# Patient Record
Sex: Male | Born: 1945
Health system: Southern US, Community
[De-identification: ages and names within clinical notes are randomized; demographics above are authoritative.]

## PROBLEM LIST (undated history)

## (undated) DIAGNOSIS — E119 Type 2 diabetes mellitus without complications: Secondary | ICD-10-CM

## (undated) DIAGNOSIS — K219 Gastro-esophageal reflux disease without esophagitis: Secondary | ICD-10-CM

## (undated) DIAGNOSIS — I10 Essential (primary) hypertension: Secondary | ICD-10-CM

## (undated) DIAGNOSIS — M1990 Unspecified osteoarthritis, unspecified site: Secondary | ICD-10-CM

## (undated) DIAGNOSIS — E079 Disorder of thyroid, unspecified: Secondary | ICD-10-CM

## (undated) DIAGNOSIS — H269 Unspecified cataract: Secondary | ICD-10-CM

## (undated) HISTORY — DX: Gastro-esophageal reflux disease without esophagitis: K21.9

## (undated) HISTORY — PX: HERNIA REPAIR: SHX51

## (undated) HISTORY — PX: TONSILLECTOMY: SUR1361

## (undated) HISTORY — DX: Type 2 diabetes mellitus without complications: E11.9

## (undated) HISTORY — DX: Essential (primary) hypertension: I10

## (undated) HISTORY — PX: EYE SURGERY: SHX253

## (undated) HISTORY — DX: Unspecified osteoarthritis, unspecified site: M19.90

## (undated) HISTORY — PX: FOOT SURGERY: SHX648

## (undated) HISTORY — DX: Unspecified cataract: H26.9

## (undated) HISTORY — DX: Disorder of thyroid, unspecified: E07.9

---

## 2007-08-01 ENCOUNTER — Emergency Department (HOSPITAL_COMMUNITY): Admission: EM | Admit: 2007-08-01 | Discharge: 2007-08-01 | Payer: Self-pay | Admitting: Emergency Medicine

## 2011-05-26 ENCOUNTER — Ambulatory Visit (INDEPENDENT_AMBULATORY_CARE_PROVIDER_SITE_OTHER): Payer: Medicare Other | Admitting: Emergency Medicine

## 2011-05-26 DIAGNOSIS — F329 Major depressive disorder, single episode, unspecified: Secondary | ICD-10-CM

## 2011-05-26 DIAGNOSIS — I10 Essential (primary) hypertension: Secondary | ICD-10-CM

## 2011-05-26 DIAGNOSIS — E039 Hypothyroidism, unspecified: Secondary | ICD-10-CM

## 2011-05-26 DIAGNOSIS — E7800 Pure hypercholesterolemia, unspecified: Secondary | ICD-10-CM

## 2011-05-26 DIAGNOSIS — E119 Type 2 diabetes mellitus without complications: Secondary | ICD-10-CM

## 2011-05-26 DIAGNOSIS — E785 Hyperlipidemia, unspecified: Secondary | ICD-10-CM

## 2011-05-26 LAB — POCT GLYCOSYLATED HEMOGLOBIN (HGB A1C): Hemoglobin A1C: 5.9

## 2011-05-26 LAB — GLUCOSE, POCT (MANUAL RESULT ENTRY): POC Glucose: 84

## 2011-05-26 MED ORDER — NIACIN ER (ANTIHYPERLIPIDEMIC) 1000 MG PO TBCR: 1000.0000 mg | EXTENDED_RELEASE_TABLET | Freq: Every day | ORAL | Status: DC

## 2011-05-26 MED ORDER — METFORMIN HCL 500 MG PO TABS: 500.0000 mg | ORAL_TABLET | Freq: Two times a day (BID) | ORAL | Status: DC

## 2011-05-26 MED ORDER — ASPIRIN 81 MG PO TABS: 81.0000 mg | ORAL_TABLET | Freq: Every day | ORAL | Status: AC

## 2011-05-26 MED ORDER — LEVOTHYROXINE SODIUM 150 MCG PO TABS: 150.0000 ug | ORAL_TABLET | Freq: Every day | ORAL | Status: DC

## 2011-05-26 MED ORDER — LISINOPRIL 20 MG PO TABS: 20.0000 mg | ORAL_TABLET | Freq: Every day | ORAL | Status: DC

## 2011-05-26 MED ORDER — ATORVASTATIN CALCIUM 10 MG PO TABS: 10.0000 mg | ORAL_TABLET | Freq: Every day | ORAL | Status: DC

## 2011-05-26 MED ORDER — FLUOXETINE HCL 20 MG PO TABS: 20.0000 mg | ORAL_TABLET | Freq: Every day | ORAL | Status: DC

## 2011-05-26 MED ORDER — HYDROCHLOROTHIAZIDE 25 MG PO TABS: 25.0000 mg | ORAL_TABLET | Freq: Every day | ORAL | Status: DC

## 2011-05-26 MED ORDER — VITAMIN D 1000 UNITS PO TABS: 1000.0000 [IU] | ORAL_TABLET | Freq: Two times a day (BID) | ORAL | Status: AC

## 2011-05-26 NOTE — Progress Notes (Signed)
  Subjective:    Patient ID: Jackson Sellers, male    DOB: 03-Mar-1946, 66 y.o.   MRN: [id number]  HPI patient enters for followup of his diabetes and high blood pressure high cholesterol thyroid disease and hyper triglycerides. He states he's been doing well he denies any complaints of chest pain shortness of breath GI or GU complaints.    Review of Systems Uncontrolled    Objective:   Physical Exam  Constitutional: He appears well-developed and well-nourished.  HENT:  Head: Normocephalic and atraumatic.  Eyes: Conjunctivae and EOM are normal. Pupils are equal, round, and reactive to light.  Neck: Normal range of motion. Neck supple. No JVD present. No tracheal deviation present. No thyromegaly present.  Cardiovascular: Normal rate, regular rhythm, normal heart sounds and intact distal pulses.  Exam reveals no gallop and no friction rub.   No murmur heard. Pulmonary/Chest: No respiratory distress. He has no wheezes. He has no rales. He exhibits no tenderness.  Abdominal: Soft. He exhibits no distension and no mass. There is no tenderness. There is no rebound and no guarding.         Assessment & Plan:  Patient is doing well with his diabetes his hemoglobin A1c was under six. We'll refill all his meds plan for his physical exam in June.

## 2011-06-20 ENCOUNTER — Ambulatory Visit (INDEPENDENT_AMBULATORY_CARE_PROVIDER_SITE_OTHER): Payer: Medicare Other | Admitting: Family Medicine

## 2011-06-20 VITALS — BP 145/76 | HR 87 | Temp 98.4°F | Resp 18 | Ht 68.0 in | Wt 262.0 lb

## 2011-06-20 DIAGNOSIS — M545 Low back pain, unspecified: Secondary | ICD-10-CM

## 2011-06-20 DIAGNOSIS — M549 Dorsalgia, unspecified: Secondary | ICD-10-CM

## 2011-06-20 LAB — POCT URINALYSIS DIPSTICK
Bilirubin, UA: NEGATIVE
Glucose, UA: NEGATIVE
Ketones, UA: NEGATIVE
Leukocytes, UA: NEGATIVE
Nitrite, UA: NEGATIVE
Protein, UA: NEGATIVE
Spec Grav, UA: 1.01
Urobilinogen, UA: 0.2
pH, UA: 5.5

## 2011-06-20 LAB — POCT UA - MICROSCOPIC ONLY
Casts, Ur, LPF, POC: NEGATIVE
Crystals, Ur, HPF, POC: NEGATIVE
Mucus, UA: NEGATIVE
Yeast, UA: NEGATIVE

## 2011-06-20 MED ORDER — MELOXICAM 7.5 MG PO TABS: 7.5000 mg | ORAL_TABLET | Freq: Every day | ORAL | Status: AC

## 2011-06-20 NOTE — Progress Notes (Signed)
66 yo man with right flank pain which is intermittent for 2 weeks, dull, and worse with movement.  No h/o kidney stones.  No blood in urine or nausea.  Overall feels fine.  Retired.  Pain worsened after sexual activity.  No new yard work or hobbies. Hasn't tried anything for pain,  Pain has not been very bad. No fevers.  Sleeping more during the day than the night since wife has started job that starts at 6 am.  O:  NAD Abdomen:  nontender Back:  Full range of motion, tender lower right 12th rib Nontender CVAT Results for orders placed in visit on 06/20/11  POCT UA - MICROSCOPIC ONLY      Component Value Range   WBC, Ur, HPF, POC 1-2     RBC, urine, microscopic 1-3     Bacteria, U Microscopic trace     Mucus, UA negative     Epithelial cells, urine per micros 1-2     Crystals, Ur, HPF, POC negative     Casts, Ur, LPF, POC negative     Yeast, UA negative    POCT URINALYSIS DIPSTICK      Component Value Range   Color, UA yellow     Clarity, UA clear     Glucose, UA negative     Bilirubin, UA negative     Ketones, UA negative     Spec Grav, UA 1.010     Blood, UA trace     pH, UA 5.5     Protein, UA negative     Urobilinogen, UA 0.2     Nitrite, UA negative     Leukocytes, UA Negative         A:  Muscle strain  P:  meloxicam 7.5 x 10 days on daily basis.

## 2011-06-20 NOTE — Patient Instructions (Signed)

## 2011-07-19 ENCOUNTER — Ambulatory Visit: Payer: Medicare Other

## 2011-07-19 ENCOUNTER — Ambulatory Visit (INDEPENDENT_AMBULATORY_CARE_PROVIDER_SITE_OTHER): Payer: Medicare Other | Admitting: Emergency Medicine

## 2011-07-19 DIAGNOSIS — M549 Dorsalgia, unspecified: Secondary | ICD-10-CM

## 2011-07-19 DIAGNOSIS — R079 Chest pain, unspecified: Secondary | ICD-10-CM

## 2011-07-19 MED ORDER — CYCLOBENZAPRINE HCL 5 MG PO TABS: 5.0000 mg | ORAL_TABLET | ORAL | Status: AC

## 2011-07-19 MED ORDER — NABUMETONE 750 MG PO TABS: 750.0000 mg | ORAL_TABLET | Freq: Two times a day (BID) | ORAL | Status: AC

## 2011-07-19 NOTE — Progress Notes (Signed)
  Subjective:    Patient ID: Jackson Sellers, male    DOB: [birth year], 66 y.o.   MRN: [id number]  HPI patient states that one month ago he had some bad episodes of cough. He feels like he may have injured his chest wall. He then had sexual intercourse with his wife and feels he may have strained something in his side. He has had persistent right lateral rib pain. He denies shortness of breath B. he denies urinary symptoms. He has seen Dr. Milus Glazier and was treated as a muscle strain however continues to have discomfort.    Review of Systems on current medical problems are under control without specific complaints of     Objective:   Physical Exam physical exam reveals an alert cooperative gentleman who is not in distress. His chest is clear to auscultation and percussion. He has tenderness over the lateral lower chest wall over the posterior right 11th and 12th ribs. There is some tenderness over the lower lumbar spine. His deep tendon reflexes are 2+ and symmetrical and motor strength is 5 out of 5.  [facility] reading (PRIMARY) by  Dr.Madalin Hughart chest x-ray shows some atelectatic areas in both bases. Cardiac shadow is upper limits of normal. Rib films do not show any definite fracture lumbar spine film shows degenerative changes otherwise unremarkable. .       Assessment & Plan:  He could have cracked a rib or strained a muscle in his upper lumbar spine. We'll check films of the right ribs as well as lumbar spine

## 2011-09-14 ENCOUNTER — Encounter: Payer: Self-pay | Admitting: Emergency Medicine

## 2011-11-09 ENCOUNTER — Encounter: Payer: Medicare Other | Admitting: Emergency Medicine

## 2012-02-22 ENCOUNTER — Other Ambulatory Visit: Payer: Self-pay | Admitting: Emergency Medicine

## 2012-02-22 ENCOUNTER — Ambulatory Visit (INDEPENDENT_AMBULATORY_CARE_PROVIDER_SITE_OTHER): Payer: Medicare Other | Admitting: Emergency Medicine

## 2012-02-22 ENCOUNTER — Encounter: Payer: Self-pay | Admitting: Emergency Medicine

## 2012-02-22 VITALS — BP 122/80 | HR 92 | Temp 98.0°F | Resp 20 | Ht 69.0 in | Wt 262.0 lb

## 2012-02-22 DIAGNOSIS — E785 Hyperlipidemia, unspecified: Secondary | ICD-10-CM

## 2012-02-22 DIAGNOSIS — M1990 Unspecified osteoarthritis, unspecified site: Secondary | ICD-10-CM

## 2012-02-22 DIAGNOSIS — K219 Gastro-esophageal reflux disease without esophagitis: Secondary | ICD-10-CM

## 2012-02-22 DIAGNOSIS — Z23 Encounter for immunization: Secondary | ICD-10-CM

## 2012-02-22 DIAGNOSIS — F32A Depression, unspecified: Secondary | ICD-10-CM

## 2012-02-22 DIAGNOSIS — Z0000 Encounter for general adult medical examination without abnormal findings: Secondary | ICD-10-CM

## 2012-02-22 DIAGNOSIS — E119 Type 2 diabetes mellitus without complications: Secondary | ICD-10-CM

## 2012-02-22 DIAGNOSIS — I10 Essential (primary) hypertension: Secondary | ICD-10-CM

## 2012-02-22 DIAGNOSIS — Z139 Encounter for screening, unspecified: Secondary | ICD-10-CM

## 2012-02-22 DIAGNOSIS — E039 Hypothyroidism, unspecified: Secondary | ICD-10-CM

## 2012-02-22 DIAGNOSIS — M129 Arthropathy, unspecified: Secondary | ICD-10-CM

## 2012-02-22 DIAGNOSIS — F329 Major depressive disorder, single episode, unspecified: Secondary | ICD-10-CM

## 2012-02-22 LAB — LIPID PANEL
HDL: 37 mg/dL — ABNORMAL LOW (ref >39)
LDL Cholesterol: 101 mg/dL — ABNORMAL HIGH (ref 0–99)
Triglycerides: 286 mg/dL — ABNORMAL HIGH (ref <150)
VLDL: 57 mg/dL — ABNORMAL HIGH (ref 0–40)

## 2012-02-22 LAB — CBC WITH DIFFERENTIAL/PLATELET
Basophils Absolute: 0 10*3/uL (ref 0.0–0.1)
Basophils Relative: 0 % (ref 0–1)
Eosinophils Relative: 3 % (ref 0–5)
Lymphocytes Relative: 26 % (ref 12–46)
MCHC: 35.4 g/dL (ref 30.0–36.0)
Monocytes Absolute: 0.6 10*3/uL (ref 0.1–1.0)
Neutro Abs: 4.9 10*3/uL (ref 1.7–7.7)
Platelets: 270 10*3/uL (ref 150–400)
RDW: 13.5 % (ref 11.5–15.5)
WBC: 7.7 10*3/uL (ref 4.0–10.5)

## 2012-02-22 LAB — COMPREHENSIVE METABOLIC PANEL
ALT: 8 U/L (ref 0–53)
AST: 14 U/L (ref 0–37)
Alkaline Phosphatase: 43 U/L (ref 39–117)
BUN: 13 mg/dL (ref 6–23)
Calcium: 9.5 mg/dL (ref 8.4–10.5)
Chloride: 102 mEq/L (ref 96–112)
Creat: 1.14 mg/dL (ref 0.50–1.35)
Potassium: 4.2 mEq/L (ref 3.5–5.3)

## 2012-02-22 LAB — POCT GLYCOSYLATED HEMOGLOBIN (HGB A1C): Hemoglobin A1C: 5.7

## 2012-02-22 NOTE — Progress Notes (Signed)
@[facility] @  Patient ID: CABE MOSKWA MRN: [id number], DOB: [birth year] 66 y.o. Date of Encounter: 02/22/2012, 2:17 PM  Primary Physician: No primary provider on file.  Chief Complaint: Physical (CPE)  HPI: 66 y.o. y/o male with history noted below here for CPE.  Doing well. No issues/complaints.  Review of Systems: Consitutional: No fever, chills, fatigue, night sweats, lymphadenopathy, or weight changes he does have chronic problems with sweats. Eyes: No visual changes, eye redness, or discharge. ENT/Mouth: Ears: No otalgia, tinnitus, hearing loss, discharge. Nose: No congestion, rhinorrhea, sinus pain, or epistaxis. Throat: No sore throat, post nasal drip, or teeth pain. Cardiovascular: No CP, palpitations, diaphoresis, DOE, edema, orthopnea, PND. Respiratory: No cough, hemoptysis, SOB, or wheezing. Gastrointestinal: No anorexia, dysphagia, reflux, pain, nausea, vomiting, hematemesis, diarrhea, constipation, BRBPR, or melena. Genitourinary: No dysuria, frequency, urgency, hematuria, incontinence, nocturia, decreased urinary stream, discharge, impotence, or testicular pain/masses. Musculoskeletal: No decreased ROM, myalgias, stiffness, joint swelling, or weakness. Skin: No rash, erythema, lesion changes, pain, warmth, jaundice, or pruritis. Neurological: No headache, dizziness, syncope, seizures, tremors, memory loss, coordination problems, or paresthesias. Psychological: No anxiety, depression, hallucinations, SI/HI. Endocrine:  The patient is hypothyroid and is on medication for this. He is also diabetic but has not checked his sugars at home. He denies polyuria or polydipsia. He does have yearly eye checks . All other systems were reviewed and are otherwise negative.  History reviewed. No pertinent past medical history.   History reviewed. No pertinent past surgical history.  Home Meds:  Prior to Admission medications   Medication Sig Start Date End Date Taking? Authorizing  Provider  aspirin 81 MG tablet Take 1 tablet (81 mg total) by mouth daily. 05/26/11  Yes Collene Gobble, MD  atorvastatin (LIPITOR) 10 MG tablet Take 1 tablet (10 mg total) by mouth daily. 05/26/11  Yes Collene Gobble, MD  cholecalciferol (VITAMIN D) 1000 UNITS tablet Take 1 tablet (1,000 Units total) by mouth 2 (two) times daily. 05/26/11  Yes Collene Gobble, MD  FLUoxetine (PROZAC) 20 MG tablet Take 1 tablet (20 mg total) by mouth daily. 05/26/11  Yes Collene Gobble, MD  hydrochlorothiazide (HYDRODIURIL) 25 MG tablet Take 1 tablet (25 mg total) by mouth daily. 05/26/11  Yes Collene Gobble, MD  levothyroxine (SYNTHROID, LEVOTHROID) 150 MCG tablet Take 1 tablet (150 mcg total) by mouth daily. 05/26/11  Yes Collene Gobble, MD  lisinopril (PRINIVIL,ZESTRIL) 20 MG tablet Take 1 tablet (20 mg total) by mouth daily. 05/26/11  Yes Collene Gobble, MD  metFORMIN (GLUCOPHAGE) 500 MG tablet Take 1 tablet (500 mg total) by mouth 2 (two) times daily with a meal. 05/26/11  Yes Collene Gobble, MD  niacin (NIASPAN) 1000 MG CR tablet Take 1 tablet (1,000 mg total) by mouth at bedtime. 05/26/11  Yes Collene Gobble, MD  meloxicam (MOBIC) 7.5 MG tablet Take 1 tablet (7.5 mg total) by mouth daily. 06/20/11 06/19/12  Elvina Sidle, MD  nabumetone (RELAFEN) 750 MG tablet Take 1 tablet (750 mg total) by mouth 2 (two) times daily. 07/19/11 07/18/12  Collene Gobble, MD    Allergies:  Allergies  Allergen Reactions  . Metoclopramide Hcl Other (See Comments)    Pt states that he" felt like something was crawling all over me"  . Erythrocin Rash  . Penicillins Rash    History   Social History  . Marital Status: Married    Spouse Name: N/A    Number of Children: N/A  . Years of Education:  N/A   Occupational History  . Not on file.   Social History Main Topics  . Smoking status: Never Smoker   . Smokeless tobacco: Never Used  . Alcohol Use: No  . Drug Use: No  . Sexually Active: Yes   Other Topics Concern  . Not on file    Social History Narrative  . No narrative on file    No family history on file.  Physical Exam:  Blood pressure 122/80, pulse 92, temperature 98 F (36.7 C), temperature source Oral, resp. rate 20, height 5\' 9"  (1.753 m), weight 262 lb (118.842 kg), SpO2 96.00%.  General: Well developed, well nourished, in no acute distress. HEENT: Normocephalic, atraumatic. Conjunctiva pink, sclera non-icteric. Pupils 2 mm constricting to 1 mm, round, regular, and equally reactive to light and accomodation. EOMI. Internal auditory canal clear. TMs with good cone of light and without pathology. Nasal mucosa pink. Nares are without discharge. No sinus tenderness. Oral mucosa pink. Dentition normal. Pharynx without exudate.   Neck: Supple. Trachea midline. No thyromegaly. Full ROM. No lymphadenopathy. Lungs: Clear to auscultation bilaterally without wheezes, rales, or rhonchi. Breathing is of normal effort and unlabored. Cardiovascular: RRR with S1 S2. No murmurs, rubs, or gallops appreciated. Distal pulses 2+ symmetrically. No carotid or abdominal bruits.  Abdomen: Soft, non-tender, non-distended with normoactive bowel sounds. No hepatosplenomegaly or masses. No rebound/guarding. No CVA tenderness. Without hernias.  Rectal: No external hemorrhoids or fissures. Rectal vault without masses.  Prostate not enlarged Genitourinary:   circumcised male. No penile lesions. Testes descended bilaterally, and smooth without tenderness or masses.  Musculoskeletal: Full range of motion and 5/5 strength throughout. Without swelling, atrophy, tenderness, crepitus, or warmth. Extremities without clubbing, cyanosis, or edema. Calves supple. Skin: Warm and moist without erythema, ecchymosis, wounds, or rash. Neuro: A+Ox3. CN II-XII grossly intact. Moves all extremities spontaneously. Full sensation throughout. Normal gait. DTR 2+ throughout upper and lower extremities. Finger to nose intact. Psych:  Responds to questions  appropriately with a normal affect.   Results for orders placed in visit on 02/22/12  IFOBT (OCCULT BLOOD)      Component Value Range   IFOBT Negative    POCT GLYCOSYLATED HEMOGLOBIN (HGB A1C)      Component Value Range   Hemoglobin A1C 5.7     Studies: CBC, CMET, Lipid, PSA, TSH,HbA1c     Assessment/Plan:  66 y.o. y/o male here for complete PE. Meds were refilled. He was given a flu vaccine. He has not had shingles vaccine yet. To financial issues. Him back to see Dr. Jarold Motto his GI doctor  because he is behind on his colonoscopies hemoglobin A1c is a goal  -  Signed, Earl Lites, MD 02/22/2012 2:17 PM

## 2012-02-23 LAB — PSA: PSA: 0.34 ng/mL (ref <=4.00)

## 2012-02-23 MED ORDER — LISINOPRIL 20 MG PO TABS: 20.0000 mg | ORAL_TABLET | Freq: Every day | ORAL | Status: DC

## 2012-02-23 MED ORDER — LEVOTHYROXINE SODIUM 150 MCG PO TABS: 150.0000 ug | ORAL_TABLET | Freq: Every day | ORAL | Status: DC

## 2012-02-23 MED ORDER — FLUOXETINE HCL 20 MG PO TABS: 20.0000 mg | ORAL_TABLET | Freq: Every day | ORAL | Status: DC

## 2012-02-23 MED ORDER — HYDROCHLOROTHIAZIDE 25 MG PO TABS: 25.0000 mg | ORAL_TABLET | Freq: Every day | ORAL | Status: DC

## 2012-02-23 MED ORDER — NIACIN ER (ANTIHYPERLIPIDEMIC) 1000 MG PO TBCR: 1000.0000 mg | EXTENDED_RELEASE_TABLET | Freq: Every day | ORAL | Status: DC

## 2012-02-23 MED ORDER — ATORVASTATIN CALCIUM 10 MG PO TABS: 10.0000 mg | ORAL_TABLET | Freq: Every day | ORAL | Status: DC

## 2012-02-23 MED ORDER — METFORMIN HCL 500 MG PO TABS: 500.0000 mg | ORAL_TABLET | Freq: Two times a day (BID) | ORAL | Status: DC

## 2012-02-23 NOTE — Addendum Note (Signed)
Addended by: Johnnette Litter on: 02/23/2012 01:55 PM   Modules accepted: Orders

## 2012-02-28 ENCOUNTER — Encounter: Payer: Self-pay | Admitting: Internal Medicine

## 2012-03-28 ENCOUNTER — Ambulatory Visit (INDEPENDENT_AMBULATORY_CARE_PROVIDER_SITE_OTHER): Payer: 59 | Admitting: Emergency Medicine

## 2012-03-28 VITALS — BP 140/80 | HR 75 | Temp 97.6°F | Resp 18 | Ht 68.0 in | Wt 269.0 lb

## 2012-03-28 DIAGNOSIS — H8390 Unspecified disease of inner ear, unspecified ear: Secondary | ICD-10-CM

## 2012-03-28 DIAGNOSIS — H8190 Unspecified disorder of vestibular function, unspecified ear: Secondary | ICD-10-CM

## 2012-03-28 DIAGNOSIS — R42 Dizziness and giddiness: Secondary | ICD-10-CM

## 2012-03-28 DIAGNOSIS — R110 Nausea: Secondary | ICD-10-CM

## 2012-03-28 MED ORDER — MECLIZINE HCL 25 MG PO TABS: ORAL_TABLET | ORAL | Status: DC

## 2012-03-28 NOTE — Patient Instructions (Signed)
Vertigo Vertigo means you feel like you or your surroundings are moving when they are not. Vertigo can be dangerous if it occurs when you are at work, driving, or performing difficult activities.  CAUSES  Vertigo occurs when there is a conflict of signals sent to your brain from the visual and sensory systems in your body. There are many different causes of vertigo, including:  Infections, especially in the inner ear.  A bad reaction to a drug or misuse of alcohol and medicines.  Withdrawal from drugs or alcohol.  Rapidly changing positions, such as lying down or rolling over in bed.  A migraine headache.  Decreased blood flow to the brain.  Increased pressure in the brain from a head injury, infection, tumor, or bleeding. SYMPTOMS  You may feel as though the world is spinning around or you are falling to the ground. Because your balance is upset, vertigo can cause nausea and vomiting. You may have involuntary eye movements (nystagmus). DIAGNOSIS  Vertigo is usually diagnosed by physical exam. If the cause of your vertigo is unknown, your caregiver may perform imaging tests, such as an MRI scan (magnetic resonance imaging). TREATMENT  Most cases of vertigo resolve on their own, without treatment. Depending on the cause, your caregiver may prescribe certain medicines. If your vertigo is related to body position issues, your caregiver may recommend movements or procedures to correct the problem. In rare cases, if your vertigo is caused by certain inner ear problems, you may need surgery. HOME CARE INSTRUCTIONS   Follow your caregiver's instructions.  Avoid driving.  Avoid operating heavy machinery.  Avoid performing any tasks that would be dangerous to you or others during a vertigo episode.  Tell your caregiver if you notice that certain medicines seem to be causing your vertigo. Some of the medicines used to treat vertigo episodes can actually make them worse in some people. SEEK  IMMEDIATE MEDICAL CARE IF:   Your medicines do not relieve your vertigo or are making it worse.  You develop problems with talking, walking, weakness, or using your arms, hands, or legs.  You develop severe headaches.  Your nausea or vomiting continues or gets worse.  You develop visual changes.  A family member notices behavioral changes.  Your condition gets worse. MAKE SURE YOU:  Understand these instructions.  Will watch your condition.  Will get help right away if you are not doing well or get worse. Document Released: 12/23/2004 Document Revised: 06/07/2011 Document Reviewed: 10/01/2010 [employer] Patient Information 2013 [employer].  

## 2012-03-28 NOTE — Progress Notes (Signed)
  Subjective:    Patient ID: Jackson Sellers, male    DOB: 12-28-1945, 66 y.o.   MRN: [id number]  HPI Pt started feeling dizzy early this morning. When he sat up, it worsened. He feels ok when sitting up--but when he moves his eyes it worsens. Has felt nauseated. Has had a history of vertigo before. Patient has been evaluated by Dr. Tia Masker. Apparently he underwent testing at that time. Apparently also did the Halpike maneuver.    Review of Systems     Objective:   Physical Exam pupils equal round regular react to light direct and consensual. Extraocular movements intact. Patient has nystagmus when he gazes to the right or upward to the right. Gaze to the left is normal. The rest of his cranial nerves are intact. Motor strength 5 out of 5. Deep tendon reflexes 2+ and symmetrical. The left TM is normal right TM is poorly seen secondary to wax but        Assessment & Plan:  Patient here with area of symptoms. We will give a try of Antivert. Will get him over to the ENT if he continues to have symptoms.

## 2012-04-12 ENCOUNTER — Ambulatory Visit (INDEPENDENT_AMBULATORY_CARE_PROVIDER_SITE_OTHER): Payer: 59 | Admitting: Emergency Medicine

## 2012-04-12 VITALS — BP 140/86 | HR 76 | Temp 97.7°F | Resp 16 | Ht 69.0 in | Wt 271.0 lb

## 2012-04-12 DIAGNOSIS — R42 Dizziness and giddiness: Secondary | ICD-10-CM | POA: Insufficient documentation

## 2012-04-12 DIAGNOSIS — H9190 Unspecified hearing loss, unspecified ear: Secondary | ICD-10-CM

## 2012-04-12 NOTE — Progress Notes (Signed)
  Subjective:    Patient ID: Jackson Sellers, male    DOB: 01-21-1946, 67 y.o.   MRN: [id number]  HPI patient enters with continued problems with vertigo. He has had long-standing problems with his which date back to the mid 90s. At that time he was evaluated by Dr. Tia Masker Dr. Haroldine Laws and by the neurologist . He enters today with a painful sensation in his left year. He has not had any ringing in his ear he feels fairly stable when he gets up as long as he moves slowly and does not turn his head quickly    Review of Systems     Objective:   Physical Exam patient is alert and cooperative and does not appear in any distress. His pupils are equal and reactive to light his neck is supple his chest is clear. There are no focal neurological sounds. Deep tendon reflexes of the upper extremities are 2+ and symmetrical motor 5 out of 5 all muscle groups, tuning fork testing reveals only better hearing in the right ear. He has a significant hearing loss all frequencies both ears.        Assessment & Plan:  Referral made to Dr. Lovey Newcomer for his evaluation. He was given a copy of his audiogram to carry with him

## 2012-04-19 ENCOUNTER — Encounter: Payer: Self-pay | Admitting: Internal Medicine

## 2012-04-19 ENCOUNTER — Encounter: Payer: Self-pay | Admitting: Gastroenterology

## 2012-04-29 ENCOUNTER — Telehealth: Payer: Self-pay

## 2012-04-29 NOTE — Telephone Encounter (Signed)
Patient wanting to speak to billing this weekend about how his balance is wrong and needs to be fixed because no one has applied his insurance to his visit yet. He's calling Monday morning to possibly speak to [place].

## 2013-02-28 ENCOUNTER — Other Ambulatory Visit: Payer: Self-pay | Admitting: Emergency Medicine

## 2013-03-02 ENCOUNTER — Encounter: Payer: Self-pay | Admitting: *Deleted

## 2013-03-05 ENCOUNTER — Telehealth: Payer: Self-pay | Admitting: Radiology

## 2013-03-05 NOTE — Telephone Encounter (Signed)
Refill/ but has already been done.

## 2013-04-20 ENCOUNTER — Other Ambulatory Visit: Payer: Self-pay | Admitting: Physician Assistant

## 2013-05-20 ENCOUNTER — Encounter: Payer: Self-pay | Admitting: Emergency Medicine

## 2013-05-20 ENCOUNTER — Ambulatory Visit (INDEPENDENT_AMBULATORY_CARE_PROVIDER_SITE_OTHER): Payer: Medicare Other | Admitting: Emergency Medicine

## 2013-05-20 VITALS — BP 138/88 | HR 84 | Temp 98.3°F | Resp 16 | Ht 67.25 in | Wt 265.6 lb

## 2013-05-20 DIAGNOSIS — I10 Essential (primary) hypertension: Secondary | ICD-10-CM

## 2013-05-20 DIAGNOSIS — E785 Hyperlipidemia, unspecified: Secondary | ICD-10-CM

## 2013-05-20 DIAGNOSIS — F329 Major depressive disorder, single episode, unspecified: Secondary | ICD-10-CM

## 2013-05-20 DIAGNOSIS — E119 Type 2 diabetes mellitus without complications: Secondary | ICD-10-CM

## 2013-05-20 DIAGNOSIS — E039 Hypothyroidism, unspecified: Secondary | ICD-10-CM

## 2013-05-20 DIAGNOSIS — F32A Depression, unspecified: Secondary | ICD-10-CM

## 2013-05-20 LAB — CBC WITH DIFFERENTIAL/PLATELET
Basophils Absolute: 0 10*3/uL (ref 0.0–0.1)
Basophils Relative: 0 % (ref 0–1)
EOS PCT: 5 % (ref 0–5)
Eosinophils Absolute: 0.3 10*3/uL (ref 0.0–0.7)
HEMATOCRIT: 42.5 % (ref 39.0–52.0)
HEMOGLOBIN: 15.3 g/dL (ref 13.0–17.0)
LYMPHS ABS: 1.3 10*3/uL (ref 0.7–4.0)
LYMPHS PCT: 20 % (ref 12–46)
MCH: 29.9 pg (ref 26.0–34.0)
MCHC: 36 g/dL (ref 30.0–36.0)
MCV: 83.2 fL (ref 78.0–100.0)
MONO ABS: 0.5 10*3/uL (ref 0.1–1.0)
MONOS PCT: 8 % (ref 3–12)
Neutro Abs: 4.4 10*3/uL (ref 1.7–7.7)
Neutrophils Relative %: 67 % (ref 43–77)
Platelets: 271 10*3/uL (ref 150–400)
RBC: 5.11 MIL/uL (ref 4.22–5.81)
RDW: 13 % (ref 11.5–15.5)
WBC: 6.6 10*3/uL (ref 4.0–10.5)

## 2013-05-20 LAB — LIPID PANEL
CHOL/HDL RATIO: 5.8 ratio
CHOLESTEROL: 226 mg/dL — AB (ref 0–200)
HDL: 39 mg/dL — AB (ref >39)
Triglycerides: 474 mg/dL — ABNORMAL HIGH (ref <150)

## 2013-05-20 LAB — COMPREHENSIVE METABOLIC PANEL
ALT: 11 U/L (ref 0–53)
AST: 14 U/L (ref 0–37)
Albumin: 4.6 g/dL (ref 3.5–5.2)
Alkaline Phosphatase: 48 U/L (ref 39–117)
BILIRUBIN TOTAL: 0.5 mg/dL (ref 0.2–1.2)
BUN: 15 mg/dL (ref 6–23)
CO2: 24 meq/L (ref 19–32)
CREATININE: 1.17 mg/dL (ref 0.50–1.35)
Calcium: 9.4 mg/dL (ref 8.4–10.5)
Chloride: 100 mEq/L (ref 96–112)
Glucose, Bld: 130 mg/dL — ABNORMAL HIGH (ref 70–99)
Potassium: 4.1 mEq/L (ref 3.5–5.3)
Sodium: 134 mEq/L — ABNORMAL LOW (ref 135–145)
Total Protein: 7 g/dL (ref 6.0–8.3)

## 2013-05-20 LAB — POCT GLYCOSYLATED HEMOGLOBIN (HGB A1C): HEMOGLOBIN A1C: 6

## 2013-05-20 LAB — TSH: TSH: 0.312 u[IU]/mL — ABNORMAL LOW (ref 0.350–4.500)

## 2013-05-20 LAB — GLUCOSE, POCT (MANUAL RESULT ENTRY): POC Glucose: 111 mg/dl — AB (ref 70–99)

## 2013-05-20 LAB — T4, FREE: Free T4: 1.52 ng/dL (ref 0.80–1.80)

## 2013-05-20 MED ORDER — LISINOPRIL 20 MG PO TABS: 20.0000 mg | ORAL_TABLET | Freq: Every day | ORAL | Status: DC

## 2013-05-20 MED ORDER — ATORVASTATIN CALCIUM 10 MG PO TABS: 10.0000 mg | ORAL_TABLET | Freq: Every day | ORAL | Status: DC

## 2013-05-20 MED ORDER — FLUOXETINE HCL 20 MG PO TABS: 20.0000 mg | ORAL_TABLET | Freq: Every day | ORAL | Status: DC

## 2013-05-20 MED ORDER — HYDROCHLOROTHIAZIDE 25 MG PO TABS: 25.0000 mg | ORAL_TABLET | Freq: Every day | ORAL | Status: DC

## 2013-05-20 MED ORDER — METFORMIN HCL 500 MG PO TABS: 500.0000 mg | ORAL_TABLET | Freq: Two times a day (BID) | ORAL | Status: DC

## 2013-05-20 MED ORDER — LEVOTHYROXINE SODIUM 150 MCG PO TABS: 150.0000 ug | ORAL_TABLET | Freq: Every day | ORAL | Status: DC

## 2013-05-20 NOTE — Progress Notes (Signed)
   Subjective:    Patient ID: Jackson Sellers, male    DOB: 1945/05/25, 68 y.o.   MRN: [id number]  HPI presents today for med refills. Has physical scheduled for September 02, 2013. He states he has been feeling well. No chest pains. He hasn't been taking Lipitor for the past couple weeks as he has been out of medication. He is fasting today.    Review of Systems  Constitutional: Negative.   HENT: Negative.   Eyes: Negative.   Respiratory: Negative.   Cardiovascular: Negative.   Gastrointestinal: Negative.   Endocrine:       He does not currently check his sugar but has been watching his diet as best as possible he is not exercising much  Genitourinary: Negative.   Skin: Negative.   Neurological: Negative.        Objective:   Physical Exam  Constitutional: He appears well-developed and well-nourished.  HENT:  Head: Normocephalic.  Eyes: Pupils are equal, round, and reactive to light.  Neck: Normal range of motion. No thyromegaly present.  Cardiovascular: Normal rate, regular rhythm and normal heart sounds.   Pulmonary/Chest: Effort normal and breath sounds normal.  Abdominal: Soft. There is no tenderness.   Extremity exam reveals thickened nodular areas on the plantar surfaces of both feet but filament testing is normal dorsalis pedis pulses are normal.  Results for orders placed in visit on 05/20/13  GLUCOSE, POCT (MANUAL RESULT ENTRY)      Result Value Ref Range   POC Glucose 111 (*) 70 - 99 mg/dl  POCT GLYCOSYLATED HEMOGLOBIN (HGB A1C)      Result Value Ref Range   Hemoglobin A1C 6.0         Assessment & Plan:  Sugar is good. No change in medication except I did stop his niacin. I decided to not fill his niacin because of the recent data about it. All of the medications were refilled. His hemoglobin A1c is at goal  at 6.0.

## 2013-08-28 ENCOUNTER — Encounter: Payer: Medicare Other | Admitting: Emergency Medicine

## 2013-11-01 ENCOUNTER — Telehealth: Payer: Self-pay

## 2013-11-01 NOTE — Telephone Encounter (Signed)
Clld pt - LMOVMTC on cell to schedule Diabetic Mgmt Follow up appt.

## 2013-12-04 ENCOUNTER — Telehealth: Payer: Self-pay | Admitting: *Deleted

## 2013-12-04 NOTE — Telephone Encounter (Signed)
Phoned patient to schedule ANNUAL MEDICARE WELLNESS EXAM and scheduled it for 12/18/13 @ 0845 with Dr. Cleta Alberts.

## 2013-12-18 ENCOUNTER — Ambulatory Visit (INDEPENDENT_AMBULATORY_CARE_PROVIDER_SITE_OTHER): Payer: Medicare Other | Admitting: Emergency Medicine

## 2013-12-18 ENCOUNTER — Encounter: Payer: Self-pay | Admitting: Emergency Medicine

## 2013-12-18 ENCOUNTER — Other Ambulatory Visit: Payer: Self-pay | Admitting: Emergency Medicine

## 2013-12-18 VITALS — BP 137/85 | HR 91 | Temp 98.4°F | Resp 18 | Ht 68.5 in | Wt 265.2 lb

## 2013-12-18 DIAGNOSIS — E119 Type 2 diabetes mellitus without complications: Secondary | ICD-10-CM

## 2013-12-18 DIAGNOSIS — E039 Hypothyroidism, unspecified: Secondary | ICD-10-CM

## 2013-12-18 DIAGNOSIS — H9190 Unspecified hearing loss, unspecified ear: Secondary | ICD-10-CM

## 2013-12-18 DIAGNOSIS — E038 Other specified hypothyroidism: Secondary | ICD-10-CM

## 2013-12-18 DIAGNOSIS — F32A Depression, unspecified: Secondary | ICD-10-CM

## 2013-12-18 DIAGNOSIS — F329 Major depressive disorder, single episode, unspecified: Secondary | ICD-10-CM

## 2013-12-18 DIAGNOSIS — Z125 Encounter for screening for malignant neoplasm of prostate: Secondary | ICD-10-CM

## 2013-12-18 DIAGNOSIS — Z0000 Encounter for general adult medical examination without abnormal findings: Secondary | ICD-10-CM

## 2013-12-18 DIAGNOSIS — H9192 Unspecified hearing loss, left ear: Secondary | ICD-10-CM

## 2013-12-18 DIAGNOSIS — E785 Hyperlipidemia, unspecified: Secondary | ICD-10-CM

## 2013-12-18 DIAGNOSIS — F3289 Other specified depressive episodes: Secondary | ICD-10-CM

## 2013-12-18 DIAGNOSIS — I10 Essential (primary) hypertension: Secondary | ICD-10-CM

## 2013-12-18 DIAGNOSIS — Z23 Encounter for immunization: Secondary | ICD-10-CM

## 2013-12-18 LAB — POCT URINALYSIS DIPSTICK
BILIRUBIN UA: NEGATIVE
Glucose, UA: NEGATIVE
KETONES UA: NEGATIVE
LEUKOCYTES UA: NEGATIVE
Nitrite, UA: NEGATIVE
PH UA: 6
Protein, UA: NEGATIVE
Spec Grav, UA: 1.01
Urobilinogen, UA: 0.2

## 2013-12-18 LAB — CBC WITH DIFFERENTIAL/PLATELET
BASOS ABS: 0 10*3/uL (ref 0.0–0.1)
Basophils Relative: 0 % (ref 0–1)
Eosinophils Absolute: 0.4 10*3/uL (ref 0.0–0.7)
Eosinophils Relative: 5 % (ref 0–5)
HEMATOCRIT: 42.8 % (ref 39.0–52.0)
HEMOGLOBIN: 15.1 g/dL (ref 13.0–17.0)
LYMPHS PCT: 23 % (ref 12–46)
Lymphs Abs: 1.9 10*3/uL (ref 0.7–4.0)
MCH: 29.6 pg (ref 26.0–34.0)
MCHC: 35.3 g/dL (ref 30.0–36.0)
MCV: 83.9 fL (ref 78.0–100.0)
MONO ABS: 0.6 10*3/uL (ref 0.1–1.0)
MONOS PCT: 8 % (ref 3–12)
NEUTROS ABS: 5.2 10*3/uL (ref 1.7–7.7)
Neutrophils Relative %: 64 % (ref 43–77)
Platelets: 307 10*3/uL (ref 150–400)
RBC: 5.1 MIL/uL (ref 4.22–5.81)
RDW: 13.1 % (ref 11.5–15.5)
WBC: 8.1 10*3/uL (ref 4.0–10.5)

## 2013-12-18 LAB — LIPID PANEL
CHOL/HDL RATIO: 4.8 ratio
Cholesterol: 183 mg/dL (ref 0–200)
HDL: 38 mg/dL — AB (ref >39)
LDL Cholesterol: 80 mg/dL (ref 0–99)
TRIGLYCERIDES: 325 mg/dL — AB (ref <150)
VLDL: 65 mg/dL — AB (ref 0–40)

## 2013-12-18 LAB — GLUCOSE, POCT (MANUAL RESULT ENTRY): POC Glucose: 119 mg/dl — AB (ref 70–99)

## 2013-12-18 LAB — POCT GLYCOSYLATED HEMOGLOBIN (HGB A1C): HEMOGLOBIN A1C: 6.3

## 2013-12-18 LAB — TSH: TSH: 0.214 u[IU]/mL — ABNORMAL LOW (ref 0.350–4.500)

## 2013-12-18 MED ORDER — SILDENAFIL CITRATE 100 MG PO TABS: 50.0000 mg | ORAL_TABLET | Freq: Every day | ORAL | Status: DC | PRN

## 2013-12-18 MED ORDER — METFORMIN HCL 500 MG PO TABS
500.0000 mg | ORAL_TABLET | Freq: Two times a day (BID) | ORAL | Status: DC
Start: 2013-12-18 — End: 2015-04-10

## 2013-12-18 MED ORDER — ATORVASTATIN CALCIUM 10 MG PO TABS: 10.0000 mg | ORAL_TABLET | Freq: Every day | ORAL | Status: DC

## 2013-12-18 MED ORDER — HYDROCHLOROTHIAZIDE 25 MG PO TABS: 25.0000 mg | ORAL_TABLET | Freq: Every day | ORAL | Status: DC

## 2013-12-18 MED ORDER — LEVOTHYROXINE SODIUM 150 MCG PO TABS: 150.0000 ug | ORAL_TABLET | Freq: Every day | ORAL | Status: DC

## 2013-12-18 MED ORDER — ZOSTER VACCINE LIVE 19400 UNT/0.65ML ~~LOC~~ SOLR: 0.6500 mL | Freq: Once | SUBCUTANEOUS | Status: DC

## 2013-12-18 MED ORDER — LISINOPRIL 20 MG PO TABS: 20.0000 mg | ORAL_TABLET | Freq: Every day | ORAL | Status: DC

## 2013-12-18 MED ORDER — FLUOXETINE HCL 20 MG PO TABS: 20.0000 mg | ORAL_TABLET | Freq: Every day | ORAL | Status: DC

## 2013-12-18 NOTE — Progress Notes (Addendum)
Subjective:    Patient ID: Jackson Sellers, male    DOB: 10-Aug-1945, 69 y.o.   MRN: [id number] This chart was scribed for Collene Gobble, MD by Littie Deeds, ED Scribe. This patient was seen in room Room/bed 22 and the patient's care was started at 8:48 AM.   HPI HPI Comments: Jackson Sellers is a 68 y.o. male who presents to the Emergency Department for an annual exam. Patient says his family is doing well, and that he is doing well, although he has episodes of diaphoresis. Patient saw Dr. Lovey Newcomer once and Dr. Zenaida Niece for the episodes of diaphoresis, but they were unable to resolve his symptoms. He has trouble laying on his left side, but he is fine when he lays on his back or right side. He takes Bonine for his symptoms with some relief. Patient exercises on and off, doing water aerobics every now and then. Patient does not want to have a colonoscopy because he feels uneasy about it, and does not have a FMHx of colon problems. Patient thinks his blood sugar is doing fine, but has gained about 10 pounds. He used to have CP when he worked for [employer], but has quit since and the symptoms have gone away. Patient is requesting to be on Viagra.   Review of Systems  Constitutional: Positive for diaphoresis. Negative for fever.  Cardiovascular: Negative for chest pain.  Genitourinary:       Erectile dysfunction - has trouble sustaining an erection      Objective:   Physical Exam CONSTITUTIONAL: Well developed/well nourished, extremely diaphoretic HEAD: Normocephalic/atraumatic EYES: EOM/PERRL ENMT: Mucous membranes moist, had his tonsils removed NECK: supple no meningeal signs SPINE: entire spine nontender CV: S1/S2 noted, no murmurs/rubs/gallops noted LUNGS: Lungs are clear to auscultation bilaterally, no apparent distress ABDOMEN: soft, nontender, no rebound or guarding GU: no cva tenderness NEURO: Pt is awake/alert, moves all extremitiesx4 EXTREMITIES: pulses normal, full  ROM, is flat-footed SKIN: warm, color normal, diaphoresis PSYCH: no abnormalities of mood noted RECTAL: prostate small without nodularity  Results for orders placed in visit on 12/18/13  POCT URINALYSIS DIPSTICK      Result Value Ref Range   Color, UA yellow     Clarity, UA clear     Glucose, UA neg     Bilirubin, UA neg     Ketones, UA neg     Spec Grav, UA 1.010     Blood, UA trace     pH, UA 6.0     Protein, UA neg     Urobilinogen, UA 0.2     Nitrite, UA neg     Leukocytes, UA Negative    GLUCOSE, POCT (MANUAL RESULT ENTRY)      Result Value Ref Range   POC Glucose 119 (*) 70 - 99 mg/dl  POCT GLYCOSYLATED HEMOGLOBIN (HGB A1C)      Result Value Ref Range   Hemoglobin A1C 6.3          Assessment & Plan:  Referral made to Dr. Joneen Roach to evaluate hearing loss left ear. He is going to think about whether he would be willing to see a GI specialist and talk with them about alternatives to screening colonoscopy. Have added Viagra to his medications. Other medications will be refilled today. He was encouraged to work on weight loss and exercise. I told him it is very important for him to work on weight loss and exercise Zostavax was refill of his meds were  refilled recheck 6

## 2013-12-19 LAB — T4, FREE: Free T4: 1.59 ng/dL (ref 0.80–1.80)

## 2013-12-19 LAB — PSA, MEDICARE: PSA: 0.29 ng/mL (ref <=4.00)

## 2014-01-01 ENCOUNTER — Ambulatory Visit (INDEPENDENT_AMBULATORY_CARE_PROVIDER_SITE_OTHER): Payer: Medicare Other | Admitting: Emergency Medicine

## 2014-01-01 ENCOUNTER — Encounter: Payer: Self-pay | Admitting: Emergency Medicine

## 2014-01-01 ENCOUNTER — Ambulatory Visit (INDEPENDENT_AMBULATORY_CARE_PROVIDER_SITE_OTHER): Payer: Medicare Other

## 2014-01-01 VITALS — BP 130/50 | HR 101 | Temp 97.9°F | Resp 16 | Ht 68.5 in | Wt 265.8 lb

## 2014-01-01 DIAGNOSIS — M542 Cervicalgia: Secondary | ICD-10-CM

## 2014-01-01 MED ORDER — HYDROCODONE-ACETAMINOPHEN 5-325 MG PO TABS: 1.0000 | ORAL_TABLET | Freq: Four times a day (QID) | ORAL | Status: DC | PRN

## 2014-01-01 NOTE — Progress Notes (Addendum)
   Subjective:    Patient ID: Jackson Sellers, male    DOB: September 27, 1945, 68 y.o.   MRN: [id number] This chart was scribed for Lesle ChrisSteven Daub, MD by Littie Deedsichard Sun, Medical Scribe. This patient was seen in room 22 and the patient's care was started at 12:20 PM.   HPI HPI Comments: Jackson Sellers is a 68 y.o. male who presents to the [facility] complaining of constant right shoulder pain that began after getting the PNA shot and flu shot here 1 week ago. He notes associated neck pain.    Review of Systems  Musculoskeletal: Positive for arthralgias (right shoulder) and neck pain.  All other systems reviewed and are negative.      Objective:   Physical Exam CONSTITUTIONAL: Well developed/well nourished HEAD: Normocephalic/atraumatic EYES: EOM/PERRL ENMT: Mucous membranes moist NECK: supple no meningeal signs, tender right side of neck, only 50% ROM to the right, pain with flexion and extension SPINE: entire spine nontender CV: S1/S2 noted, no murmurs/rubs/gallops noted LUNGS: Lungs are clear to auscultation bilaterally, no apparent distress ABDOMEN: soft, nontender, no rebound or guarding GU: no cva tenderness NEURO: Pt is awake/alert, moves all extremitiesx4, reflexes and strength are symmetrical EXTREMITIES: pulses normal, full ROM SKIN: warm, color normal PSYCH: no abnormalities of mood [facility] reading (PRIMARY) by  Dr.Daub there is an inferior spur at C4 there is degenerative disc disease at C5-C6        Assessment & Plan:  Advise he continue to apply ice to the area. I  advised him to take hydrocodone for severe pain. I suspect his neck and shoulder discomfort are secondary to degenerative disc disease and not secondary to his pneumonia vaccine

## 2014-05-16 ENCOUNTER — Other Ambulatory Visit: Payer: Self-pay

## 2014-05-16 DIAGNOSIS — F32A Depression, unspecified: Secondary | ICD-10-CM

## 2014-05-16 DIAGNOSIS — F329 Major depressive disorder, single episode, unspecified: Secondary | ICD-10-CM

## 2014-05-16 MED ORDER — FLUOXETINE HCL 20 MG PO TABS: 20.0000 mg | ORAL_TABLET | Freq: Every day | ORAL | Status: DC

## 2014-06-18 ENCOUNTER — Ambulatory Visit: Payer: Medicare Other | Admitting: Emergency Medicine

## 2014-07-11 ENCOUNTER — Encounter: Payer: Self-pay | Admitting: *Deleted

## 2014-09-12 ENCOUNTER — Encounter: Payer: Self-pay | Admitting: *Deleted

## 2014-11-06 ENCOUNTER — Encounter: Payer: Self-pay | Admitting: *Deleted

## 2014-11-23 ENCOUNTER — Other Ambulatory Visit: Payer: Self-pay | Admitting: Emergency Medicine

## 2014-12-31 ENCOUNTER — Encounter: Payer: Self-pay | Admitting: Emergency Medicine

## 2015-02-22 DIAGNOSIS — E119 Type 2 diabetes mellitus without complications: Secondary | ICD-10-CM | POA: Insufficient documentation

## 2015-02-22 DIAGNOSIS — M62830 Muscle spasm of back: Secondary | ICD-10-CM | POA: Insufficient documentation

## 2015-02-22 DIAGNOSIS — Z880 Allergy status to penicillin: Secondary | ICD-10-CM | POA: Insufficient documentation

## 2015-02-22 DIAGNOSIS — Z79899 Other long term (current) drug therapy: Secondary | ICD-10-CM | POA: Diagnosis not present

## 2015-02-22 DIAGNOSIS — M545 Low back pain: Secondary | ICD-10-CM | POA: Insufficient documentation

## 2015-02-22 DIAGNOSIS — I10 Essential (primary) hypertension: Secondary | ICD-10-CM | POA: Diagnosis not present

## 2015-02-22 DIAGNOSIS — Z7982 Long term (current) use of aspirin: Secondary | ICD-10-CM | POA: Insufficient documentation

## 2015-02-22 DIAGNOSIS — M25559 Pain in unspecified hip: Secondary | ICD-10-CM | POA: Diagnosis present

## 2015-02-23 ENCOUNTER — Encounter (HOSPITAL_COMMUNITY): Payer: Self-pay | Admitting: Emergency Medicine

## 2015-02-23 ENCOUNTER — Emergency Department (HOSPITAL_COMMUNITY)
Admission: EM | Admit: 2015-02-23 | Discharge: 2015-02-23 | Disposition: A | Discharge status: Home / Self Care | Payer: Medicare Other | Attending: Emergency Medicine | Admitting: Emergency Medicine

## 2015-02-23 DIAGNOSIS — M62830 Muscle spasm of back: Secondary | ICD-10-CM

## 2015-02-23 MED ORDER — DIAZEPAM 5 MG PO TABS: 5.0000 mg | ORAL_TABLET | Freq: Two times a day (BID) | ORAL | Status: DC

## 2015-02-23 MED ORDER — METHOCARBAMOL 500 MG PO TABS
750.0000 mg | ORAL_TABLET | Freq: Once | ORAL | Status: AC
  Administered 2015-02-23: 750 mg via ORAL
  Filled 2015-02-23 (×2): qty 2

## 2015-02-23 MED ORDER — HYDROCODONE-ACETAMINOPHEN 5-325 MG PO TABS
1.0000 | ORAL_TABLET | Freq: Once | ORAL | Status: AC
  Administered 2015-02-23: 1 via ORAL
  Filled 2015-02-23: qty 1

## 2015-02-23 MED ORDER — HYDROCODONE-ACETAMINOPHEN 5-325 MG PO TABS: 1.0000 | ORAL_TABLET | ORAL | Status: DC | PRN

## 2015-02-23 NOTE — ED Notes (Signed)
Bed: [facility] Expected date:  Expected time:  Means of arrival:  Comments: 

## 2015-02-23 NOTE — ED Provider Notes (Signed)
CSN: [id number]     Arrival date & time 02/22/15  2350 History   First MD Initiated Contact with Patient 02/23/15 0244     Chief Complaint  Patient presents with  . Hip Pain     (Consider location/radiation/quality/duration/timing/severity/associated sxs/prior Treatment) Patient is a 69 y.o. male presenting with hip pain. The history is provided by the patient. No language interpreter was used.  Hip Pain This is a new problem. The current episode started yesterday. The problem occurs constantly. The problem has been unchanged. Pertinent negatives include no chills or fever. Associated symptoms comments: Patient felt he strained his back while putting up Christmas decorations yesterday. Last night he got up to the bathroom and felt sharp, stabbing pain that is persistent, prompting ED evaluation and management. He denies any pain when lying still. The pain is in the right lateral/flank are and the location of the pain is not moving and does not radiate. No abdominal pain. No urinary symptoms of frequency, hesitancy or hematuria. No history of kidney stones. No nausea or vomiting. .    Past Medical History  Diagnosis Date  . Diabetes mellitus without complication (HCC)   . Hypertension    Past Surgical History  Procedure Laterality Date  . Hernia repair     History reviewed. No pertinent family history. Social History  Substance Use Topics  . Smoking status: Never Smoker   . Smokeless tobacco: Never Used  . Alcohol Use: No    Review of Systems  Constitutional: Negative for fever and chills.  Gastrointestinal: Negative.   Genitourinary: Negative for dysuria, hematuria, scrotal swelling, difficulty urinating and testicular pain.  Musculoskeletal: Negative.        See HPI  Skin: Negative.   Neurological: Negative.       Allergies  Metoclopramide hcl; Erythrocin; and Penicillins  Home Medications   Prior to Admission medications   Medication Sig Start Date End Date  Taking? Authorizing Provider  aspirin 81 MG tablet Take 1 tablet (81 mg total) by mouth daily. 05/26/11  Yes Collene GobbleSteven A Daub, MD  atorvastatin (LIPITOR) 10 MG tablet Take 1 tablet (10 mg total) by mouth daily. 12/18/13  Yes Collene GobbleSteven A Daub, MD  cholecalciferol (VITAMIN D) 1000 UNITS tablet Take 1 tablet (1,000 Units total) by mouth 2 (two) times daily. 05/26/11  Yes Collene GobbleSteven A Daub, MD  FLUoxetine (PROZAC) 20 MG tablet TAKE 1 TABLET BY MOUTH DAILY  "OV NEEDED" 11/25/14  Yes Chelle Jeffery, PA-C  hydrochlorothiazide (HYDRODIURIL) 25 MG tablet Take 1 tablet (25 mg total) by mouth daily. 12/18/13  Yes Collene GobbleSteven A Daub, MD  levothyroxine (SYNTHROID, LEVOTHROID) 150 MCG tablet Take 1 tablet (150 mcg total) by mouth daily. 12/18/13  Yes Collene GobbleSteven A Daub, MD  lisinopril (PRINIVIL,ZESTRIL) 20 MG tablet Take 1 tablet (20 mg total) by mouth daily. 12/18/13  Yes Collene GobbleSteven A Daub, MD  metFORMIN (GLUCOPHAGE) 500 MG tablet Take 1 tablet (500 mg total) by mouth 2 (two) times daily with a meal. 12/18/13  Yes Collene GobbleSteven A Daub, MD  zoster vaccine live, PF, (ZOSTAVAX) [id number] UNT/0.65ML injection Inject 19,400 Units into the skin once. 12/18/13  Yes Collene GobbleSteven A Daub, MD  sildenafil (VIAGRA) 100 MG tablet Take 0.5-1 tablets (50-100 mg total) by mouth daily as needed for erectile dysfunction. 12/18/13   Collene GobbleSteven A Daub, MD   BP 150/110 mmHg  Pulse 83  Temp(Src) 98.1 F (36.7 C) (Oral)  Resp 16  SpO2 97% Physical Exam  Constitutional: He is oriented to person, place, and  time. He appears well-developed and well-nourished.  HENT:  Head: Normocephalic.  Neck: Normal range of motion. Neck supple.  Cardiovascular: Normal rate and regular rhythm.   Pulmonary/Chest: Effort normal and breath sounds normal.  Abdominal: Soft. Bowel sounds are normal. There is no tenderness. There is no rebound and no guarding.  Musculoskeletal: Normal range of motion.       Arms: Tender to right lateral lower back reproducing pain of complaint.  Neurological: He is  alert and oriented to person, place, and time.  Skin: Skin is warm and dry. No rash noted.  Psychiatric: He has a normal mood and affect.    ED Course  Procedures (including critical care time) Labs Review Labs Reviewed - No data to display  Imaging Review No results found. I have personally reviewed and evaluated these images and lab results as part of my medical decision-making.   EKG Interpretation None      MDM   Final diagnoses:  None    1. Muscle spasm 2. Back pain  Pain is improved with Valium, Norco. The patient is ambulatory without difficulty. Pain following muscular pattern. No concern for infection, stone, or fracture. He is stable for discharge.     Elpidio Anis, PA-C 02/23/15 0430  Derwood Kaplan, MD 02/23/15 [phone]

## 2015-02-23 NOTE — ED Notes (Signed)
Pt c/o right hip pain/buttock/side pain starting today after over-exertion doing yard work. Said pain and spasms increased last night. Has not taken any medication for pain relief. No other c/c.

## 2015-02-23 NOTE — Discharge Instructions (Signed)
Heat Therapy °Heat therapy can help ease sore, stiff, injured, and tight muscles and joints. Heat relaxes your muscles, which may help ease your pain.  °RISKS AND COMPLICATIONS °If you have any of the following conditions, do not use heat therapy unless your health care provider has approved: °· Poor circulation. °· Healing wounds or scarred skin in the area being treated. °· Diabetes, heart disease, or high blood pressure. °· Not being able to feel (numbness) the area being treated. °· Unusual swelling of the area being treated. °· Active infections. °· Blood clots. °· Cancer. °· Inability to communicate pain. This may include young children and people who have problems with their brain function (dementia). °· Pregnancy. °Heat therapy should only be used on old, pre-existing, or long-lasting (chronic) injuries. Do not use heat therapy on new injuries unless directed by your health care provider. °HOW TO USE HEAT THERAPY °There are several different kinds of heat therapy, including: °· Moist heat pack. °· Warm water bath. °· Hot water bottle. °· Electric heating pad. °· Heated gel pack. °· Heated wrap. °· Electric heating pad. °Use the heat therapy method suggested by your health care provider. Follow your health care provider's instructions on when and how to use heat therapy. °GENERAL HEAT THERAPY RECOMMENDATIONS °· Do not sleep while using heat therapy. Only use heat therapy while you are awake. °· Your skin may turn pink while using heat therapy. Do not use heat therapy if your skin turns red. °· Do not use heat therapy if you have new pain. °· High heat or long exposure to heat can cause burns. Be careful when using heat therapy to avoid burning your skin. °· Do not use heat therapy on areas of your skin that are already irritated, such as with a rash or sunburn. °SEEK MEDICAL CARE IF: °· You have blisters, redness, swelling, or numbness. °· You have new pain. °· Your pain is worse. °MAKE SURE  YOU: °· Understand these instructions. °· Will watch your condition. °· Will get help right away if you are not doing well or get worse. °  °This information is not intended to replace advice given to you by your health care provider. Make sure you discuss any questions you have with your health care provider. °  °Document Released: 06/07/2011 Document Revised: 04/05/2014 Document Reviewed: 05/08/2013 °Elsevier Interactive Patient Education ©2016 [employer] ° °Muscle Cramps and Spasms °Muscle cramps and spasms occur when a muscle or muscles tighten and you have no control over this tightening (involuntary muscle contraction). They are a common problem and can develop in any muscle. The most common place is in the calf muscles of the leg. Both muscle cramps and muscle spasms are involuntary muscle contractions, but they also have differences:  °· Muscle cramps are sporadic and painful. They may last a few seconds to a quarter of an hour. Muscle cramps are often more forceful and last longer than muscle spasms. °· Muscle spasms may or may not be painful. They may also last just a few seconds or much longer. °CAUSES  °It is uncommon for cramps or spasms to be due to a serious underlying problem. In many cases, the cause of cramps or spasms is unknown. Some common causes are:  °· Overexertion.   °· Overuse from repetitive motions (doing the same thing over and over).   °· Remaining in a certain position for a long period of time.   °· Improper preparation, form, or technique while performing a sport or activity.   °·   Dehydration.   °· Injury.   °· Side effects of some medicines.   °· Abnormally low levels of the salts and ions in your blood (electrolytes), especially potassium and calcium. This could happen if you are taking water pills (diuretics) or you are pregnant.   °Some underlying medical problems can make it more likely to develop cramps or spasms. These include, but are not limited to:  °· Diabetes.    °· Parkinson disease.   °· Hormone disorders, such as thyroid problems.   °· Alcohol abuse.   °· Diseases specific to muscles, joints, and bones.   °· Blood vessel disease where not enough blood is getting to the muscles.   °HOME CARE INSTRUCTIONS  °· Stay well hydrated. Drink enough water and fluids to keep your urine clear or pale yellow. °· It may be helpful to massage, stretch, and relax the affected muscle. °· For tight or tense muscles, use a warm towel, heating pad, or hot shower water directed to the affected area. °· If you are sore or have pain after a cramp or spasm, applying ice to the affected area may relieve discomfort. °¨ Put ice in a plastic bag. °¨ Place a towel between your skin and the bag. °¨ Leave the ice on for 15-20 minutes, 03-04 times a day. °· Medicines used to treat a known cause of cramps or spasms may help reduce their frequency or severity. Only take over-the-counter or prescription medicines as directed by your caregiver. °SEEK MEDICAL CARE IF:  °Your cramps or spasms get more severe, more frequent, or do not improve over time.  °MAKE SURE YOU:  °· Understand these instructions. °· Will watch your condition. °· Will get help right away if you are not doing well or get worse. °  °This information is not intended to replace advice given to you by your health care provider. Make sure you discuss any questions you have with your health care provider. °  °Document Released: 09/04/2001 Document Revised: 07/10/2012 Document Reviewed: 03/01/2012 °Elsevier Interactive Patient Education ©2016 Elsevier Inc. ° °

## 2015-02-24 ENCOUNTER — Other Ambulatory Visit: Payer: Self-pay | Admitting: Physician Assistant

## 2015-02-25 ENCOUNTER — Other Ambulatory Visit: Payer: Self-pay | Admitting: *Deleted

## 2015-02-25 DIAGNOSIS — F329 Major depressive disorder, single episode, unspecified: Secondary | ICD-10-CM

## 2015-02-25 DIAGNOSIS — F32A Depression, unspecified: Secondary | ICD-10-CM

## 2015-02-25 MED ORDER — FLUOXETINE HCL 20 MG PO TABS: ORAL_TABLET | ORAL | Status: DC

## 2015-04-10 ENCOUNTER — Encounter: Payer: Self-pay | Admitting: Emergency Medicine

## 2015-04-10 ENCOUNTER — Ambulatory Visit (INDEPENDENT_AMBULATORY_CARE_PROVIDER_SITE_OTHER): Payer: Medicare Other | Admitting: Emergency Medicine

## 2015-04-10 ENCOUNTER — Other Ambulatory Visit: Payer: Self-pay | Admitting: Emergency Medicine

## 2015-04-10 VITALS — BP 126/68 | HR 82 | Temp 98.1°F | Resp 16 | Ht 67.75 in | Wt 265.8 lb

## 2015-04-10 DIAGNOSIS — Z1211 Encounter for screening for malignant neoplasm of colon: Secondary | ICD-10-CM | POA: Diagnosis not present

## 2015-04-10 DIAGNOSIS — Z0000 Encounter for general adult medical examination without abnormal findings: Secondary | ICD-10-CM

## 2015-04-10 DIAGNOSIS — I10 Essential (primary) hypertension: Secondary | ICD-10-CM | POA: Diagnosis not present

## 2015-04-10 DIAGNOSIS — E119 Type 2 diabetes mellitus without complications: Secondary | ICD-10-CM | POA: Diagnosis not present

## 2015-04-10 DIAGNOSIS — E785 Hyperlipidemia, unspecified: Secondary | ICD-10-CM | POA: Diagnosis not present

## 2015-04-10 DIAGNOSIS — Z125 Encounter for screening for malignant neoplasm of prostate: Secondary | ICD-10-CM | POA: Diagnosis not present

## 2015-04-10 DIAGNOSIS — Z1159 Encounter for screening for other viral diseases: Secondary | ICD-10-CM

## 2015-04-10 DIAGNOSIS — E039 Hypothyroidism, unspecified: Secondary | ICD-10-CM

## 2015-04-10 DIAGNOSIS — E038 Other specified hypothyroidism: Secondary | ICD-10-CM

## 2015-04-10 DIAGNOSIS — Z23 Encounter for immunization: Secondary | ICD-10-CM | POA: Diagnosis not present

## 2015-04-10 LAB — LIPID PANEL
CHOL/HDL RATIO: 5 ratio (ref <=5.0)
Cholesterol: 200 mg/dL (ref 125–200)
HDL: 40 mg/dL (ref >=40)
LDL CALC: 85 mg/dL (ref <130)
TRIGLYCERIDES: 376 mg/dL — AB (ref <150)
VLDL: 75 mg/dL — ABNORMAL HIGH (ref <30)

## 2015-04-10 LAB — POCT URINALYSIS DIP (MANUAL ENTRY)
BILIRUBIN UA: NEGATIVE
BILIRUBIN UA: NEGATIVE
GLUCOSE UA: NEGATIVE
Leukocytes, UA: NEGATIVE
Nitrite, UA: NEGATIVE
PH UA: 5.5
Protein Ur, POC: NEGATIVE
Spec Grav, UA: 1.02
Urobilinogen, UA: 0.2

## 2015-04-10 LAB — CBC WITH DIFFERENTIAL/PLATELET
BASOS PCT: 1 % (ref 0–1)
Basophils Absolute: 0.1 10*3/uL (ref 0.0–0.1)
EOS ABS: 0.2 10*3/uL (ref 0.0–0.7)
EOS PCT: 3 % (ref 0–5)
HCT: 43.4 % (ref 39.0–52.0)
Hemoglobin: 15 g/dL (ref 13.0–17.0)
LYMPHS ABS: 1.8 10*3/uL (ref 0.7–4.0)
Lymphocytes Relative: 22 % (ref 12–46)
MCH: 29.4 pg (ref 26.0–34.0)
MCHC: 34.6 g/dL (ref 30.0–36.0)
MCV: 84.9 fL (ref 78.0–100.0)
MONO ABS: 0.7 10*3/uL (ref 0.1–1.0)
MONOS PCT: 8 % (ref 3–12)
MPV: 10.6 fL (ref 8.6–12.4)
Neutro Abs: 5.5 10*3/uL (ref 1.7–7.7)
Neutrophils Relative %: 66 % (ref 43–77)
PLATELETS: 344 10*3/uL (ref 150–400)
RBC: 5.11 MIL/uL (ref 4.22–5.81)
RDW: 13.2 % (ref 11.5–15.5)
WBC: 8.3 10*3/uL (ref 4.0–10.5)

## 2015-04-10 LAB — MICROALBUMIN, URINE: Microalb, Ur: 0.3 mg/dL

## 2015-04-10 LAB — COMPLETE METABOLIC PANEL WITH GFR
ALT: 11 U/L (ref 9–46)
AST: 13 U/L (ref 10–35)
Albumin: 4.5 g/dL (ref 3.6–5.1)
Alkaline Phosphatase: 52 U/L (ref 40–115)
BUN: 21 mg/dL (ref 7–25)
CHLORIDE: 99 mmol/L (ref 98–110)
CO2: 18 mmol/L — ABNORMAL LOW (ref 20–31)
Calcium: 9.6 mg/dL (ref 8.6–10.3)
Creat: 1.21 mg/dL (ref 0.70–1.25)
GFR, EST NON AFRICAN AMERICAN: 61 mL/min (ref >=60)
GFR, Est African American: 70 mL/min (ref >=60)
GLUCOSE: 145 mg/dL — AB (ref 65–99)
POTASSIUM: 4.1 mmol/L (ref 3.5–5.3)
SODIUM: 135 mmol/L (ref 135–146)
Total Bilirubin: 0.7 mg/dL (ref 0.2–1.2)
Total Protein: 6.8 g/dL (ref 6.1–8.1)

## 2015-04-10 LAB — POC MICROSCOPIC URINALYSIS (UMFC): Mucus: ABSENT

## 2015-04-10 LAB — TSH: TSH: 0.515 u[IU]/mL (ref 0.350–4.500)

## 2015-04-10 LAB — GLUCOSE, POCT (MANUAL RESULT ENTRY): POC GLUCOSE: 142 mg/dL — AB (ref 70–99)

## 2015-04-10 LAB — POCT GLYCOSYLATED HEMOGLOBIN (HGB A1C): HEMOGLOBIN A1C: 7

## 2015-04-10 LAB — HEPATITIS C ANTIBODY: HCV AB: NEGATIVE

## 2015-04-10 MED ORDER — HYDROCHLOROTHIAZIDE 25 MG PO TABS: 25.0000 mg | ORAL_TABLET | Freq: Every day | ORAL | Status: DC

## 2015-04-10 MED ORDER — ATORVASTATIN CALCIUM 10 MG PO TABS: 10.0000 mg | ORAL_TABLET | Freq: Every day | ORAL | Status: DC

## 2015-04-10 MED ORDER — FLUOXETINE HCL 20 MG PO TABS: ORAL_TABLET | ORAL | Status: DC

## 2015-04-10 MED ORDER — LEVOTHYROXINE SODIUM 150 MCG PO TABS: 150.0000 ug | ORAL_TABLET | Freq: Every day | ORAL | Status: DC

## 2015-04-10 MED ORDER — METFORMIN HCL 500 MG PO TABS: 500.0000 mg | ORAL_TABLET | Freq: Two times a day (BID) | ORAL | Status: DC

## 2015-04-10 MED ORDER — ZOSTER VACCINE LIVE 19400 UNT/0.65ML ~~LOC~~ SOLR: 0.6500 mL | Freq: Once | SUBCUTANEOUS | Status: DC

## 2015-04-10 MED ORDER — LISINOPRIL 20 MG PO TABS: 20.0000 mg | ORAL_TABLET | Freq: Every day | ORAL | Status: DC

## 2015-04-10 NOTE — Progress Notes (Signed)
Patient ID: Jackson Sellers, male   DOB: 08/24/1945, 70 y.o.   MRN: [id number]     By signing my name below, I, Littie Deeds, attest that this documentation has been prepared under the direction and in the presence of Lesle Chris, MD.  Electronically Signed: Littie Deeds, Medical Scribe. 04/10/2015. 9:17 AM.   Chief Complaint:  Chief Complaint  Patient presents with  . Annual Exam  . Medication Refill    all meds    HPI: Jackson Sellers is a 70 y.o. male who reports to [facility] today for an annual physical exam. Patient has been doing well overall. He does not see the eye doctor annually, but he plans to make an appointment at [facility]. He is not UTD on colonoscopy, but he is amenable to seeing Dr. Loreta Ave for colonoscopy. Patient goes to dermatology about every 3-4 years to have his nevi evaluated. He does not see any other doctors.  He believes he may have pulled a muscle in his back a few weeks ago while putting up a Christmas decoration. The pain has improved, but he notes that the pain still lingers.  Past Medical History  Diagnosis Date  . Diabetes mellitus without complication (HCC)   . Hypertension   . Arthritis   . Cataract   . GERD (gastroesophageal reflux disease)   . Thyroid disease    Past Surgical History  Procedure Laterality Date  . Hernia repair    . Eye surgery     Social History   Social History  . Marital Status: Married    Spouse Name: N/A  . Number of Children: N/A  . Years of Education: N/A   Occupational History  . Retired    Social History Main Topics  . Smoking status: Never Smoker   . Smokeless tobacco: Never Used  . Alcohol Use: No  . Drug Use: No  . Sexual Activity: Yes   Other Topics Concern  . None   Social History Narrative   Married   Education: Automotive engineer   Exercise: Yes   Family History  Problem Relation Age of Onset  . Stroke Mother   . Cancer Father     lung  . Heart disease Father   . Hyperlipidemia Father     Allergies  Allergen Reactions  . Metoclopramide Hcl Other (See Comments)    Pt states that he" felt like something was crawling all over me"  . Erythrocin Rash  . Penicillins Rash    Has patient had a PCN reaction causing immediate rash, facial/tongue/throat swelling, SOB or lightheadedness with hypotension: No Has patient had a PCN reaction causing severe rash involving mucus membranes or skin necrosis: No Has patient had a PCN reaction that required hospitalization No Has patient had a PCN reaction occurring within the last 10 years:No If all of the above answers are "NO", then may proceed with Cephalosporin use.    Prior to Admission medications   Medication Sig Start Date End Date Taking? Authorizing Provider  aspirin 81 MG tablet Take 1 tablet (81 mg total) by mouth daily. 05/26/11  Yes Collene Gobble, MD  atorvastatin (LIPITOR) 10 MG tablet Take 1 tablet (10 mg total) by mouth daily. 12/18/13  Yes Collene Gobble, MD  cholecalciferol (VITAMIN D) 1000 UNITS tablet Take 1 tablet (1,000 Units total) by mouth 2 (two) times daily. 05/26/11  Yes Collene Gobble, MD  FLUoxetine (PROZAC) 20 MG tablet TAKE 1 TABLET BY MOUTH DAILY  "NO MORE  REFILLS WITHOUT OFFICE VISIT" 02/25/15  Yes Collene GobbleSteven A Dalyce Renne, MD  hydrochlorothiazide (HYDRODIURIL) 25 MG tablet Take 1 tablet (25 mg total) by mouth daily. 12/18/13  Yes Collene GobbleSteven A Torunn Chancellor, MD  levothyroxine (SYNTHROID, LEVOTHROID) 150 MCG tablet Take 1 tablet (150 mcg total) by mouth daily. 12/18/13  Yes Collene GobbleSteven A Brigid Vandekamp, MD  lisinopril (PRINIVIL,ZESTRIL) 20 MG tablet Take 1 tablet (20 mg total) by mouth daily. 12/18/13  Yes Collene GobbleSteven A Nesbit Michon, MD  metFORMIN (GLUCOPHAGE) 500 MG tablet Take 1 tablet (500 mg total) by mouth 2 (two) times daily with a meal. 12/18/13  Yes Collene GobbleSteven A Samirah Scarpati, MD  sildenafil (VIAGRA) 100 MG tablet Take 0.5-1 tablets (50-100 mg total) by mouth daily as needed for erectile dysfunction. 12/18/13  Yes Collene GobbleSteven A Siria Calandro, MD  diazepam (VALIUM) 5 MG tablet Take 1 tablet  (5 mg total) by mouth 2 (two) times daily. Patient not taking: Reported on 04/10/2015 02/23/15   Elpidio AnisShari Upstill, PA-C  HYDROcodone-acetaminophen (NORCO/VICODIN) 5-325 MG tablet Take 1 tablet by mouth every 4 (four) hours as needed. Patient not taking: Reported on 04/10/2015 02/23/15   Elpidio AnisShari Upstill, PA-C  zoster vaccine live, PF, (ZOSTAVAX) [id number] UNT/0.65ML injection Inject 19,400 Units into the skin once. Patient not taking: Reported on 04/10/2015 12/18/13   Collene GobbleSteven A Damesha Lawler, MD     ROS: The patient denies fevers, chills, night sweats, unintentional weight loss, chest pain, palpitations, wheezing, dyspnea on exertion, nausea, vomiting, abdominal pain, dysuria, hematuria, melena, numbness, weakness, or tingling.  All other systems have been reviewed and were otherwise negative with the exception of those mentioned in the HPI and as above.    PHYSICAL EXAM: Filed Vitals:   04/10/15 0849  BP: 126/68  Pulse: 82  Temp: 98.1 F (36.7 C)  Resp: 16   Body mass index is 40.71 kg/(m^2).   General: Alert, no acute distress HEENT:  Normocephalic, atraumatic, oropharynx patent. Wax impaction both ears. [facility] Cardiovascular:  Regular rate and rhythm, no rubs murmurs or gallops.  No Carotid bruits, radial pulse intact. No pedal edema.  Respiratory: Clear to auscultation bilaterally.  No wheezes, rales, or rhonchi.  No cyanosis, no use of accessory musculature Abdominal: Scar around umbilicus. Upper abdominal ventral weakness.  Musculoskeletal: Gait intact. No edema, tenderness Skin: No rashes. Neurologic: Facial musculature symmetric. Psychiatric: Patient acts appropriately throughout our interaction. Lymphatic: No cervical or submandibular lymphadenopathy Genitourinary: Small prostate. No nodules.   LABS: Results for orders placed or performed in visit on 04/10/15  POCT urinalysis dipstick  Result Value Ref Range   Color, UA yellow yellow   Clarity, UA clear clear   Glucose, UA  negative negative   Bilirubin, UA negative negative   Ketones, POC UA negative negative   Spec Grav, UA 1.020    Blood, UA trace-lysed (A) negative   pH, UA 5.5    Protein Ur, POC negative negative   Urobilinogen, UA 0.2    Nitrite, UA Negative Negative   Leukocytes, UA Negative Negative  POCT Microscopic Urinalysis ([facility])  Result Value Ref Range   WBC,UR,HPF,POC None None WBC/hpf   RBC,UR,HPF,POC None None RBC/hpf   Bacteria None None, Too numerous to count   Mucus Absent Absent   Epithelial Cells, UR Per Microscopy None None, Too numerous to count cells/hpf  POCT glucose (manual entry)  Result Value Ref Range   POC Glucose 142 (A) 70 - 99 mg/dl  POCT glycosylated hemoglobin (Hb A1C)  Result Value Ref Range   Hemoglobin A1C 7.0  EKG/XRAY:   Primary read interpreted by Dr. Cleta Alberts at [facility].   ASSESSMENT/PLAN:     Gross sideeffects, risk and benefits, and alternatives of medications d/w patient. Patient is aware that all medications have potential sideeffects and we are unable to predict every sideeffect or drug-drug interaction that may occur.  Lesle Chris MD 04/10/2015 9:17 AM

## 2015-04-11 NOTE — Addendum Note (Signed)
Addended by: Johnnette LitterARDWELL, Brenleigh Collet M on: 04/11/2015 08:43 AM   Modules accepted: Kipp BroodSmartSet

## 2015-04-12 LAB — PSA, MEDICARE: PSA: 0.29 ng/mL (ref <=4.00)

## 2015-04-26 ENCOUNTER — Telehealth: Payer: Self-pay | Admitting: Family Medicine

## 2015-04-26 NOTE — Telephone Encounter (Signed)
lmom about new appt Dr Neva Seat has moved his appt from Mon to Wed his new appt date is 10-08-15 :00

## 2015-05-01 LAB — HM DIABETES EYE EXAM

## 2015-06-06 ENCOUNTER — Encounter: Payer: Self-pay | Admitting: Family Medicine

## 2015-07-23 ENCOUNTER — Encounter: Payer: Self-pay | Admitting: Family Medicine

## 2015-10-03 ENCOUNTER — Ambulatory Visit (INDEPENDENT_AMBULATORY_CARE_PROVIDER_SITE_OTHER): Payer: Medicare Other

## 2015-10-03 ENCOUNTER — Ambulatory Visit (INDEPENDENT_AMBULATORY_CARE_PROVIDER_SITE_OTHER): Payer: Medicare Other | Admitting: Emergency Medicine

## 2015-10-03 VITALS — BP 142/80 | HR 92 | Temp 97.5°F | Resp 20 | Ht 68.5 in | Wt 262.0 lb

## 2015-10-03 DIAGNOSIS — E119 Type 2 diabetes mellitus without complications: Secondary | ICD-10-CM

## 2015-10-03 DIAGNOSIS — M5441 Lumbago with sciatica, right side: Secondary | ICD-10-CM | POA: Diagnosis not present

## 2015-10-03 DIAGNOSIS — I10 Essential (primary) hypertension: Secondary | ICD-10-CM

## 2015-10-03 LAB — GLUCOSE, POCT (MANUAL RESULT ENTRY): POC Glucose: 134 mg/dl — AB (ref 70–99)

## 2015-10-03 LAB — POCT GLYCOSYLATED HEMOGLOBIN (HGB A1C): Hemoglobin A1C: 6.7

## 2015-10-03 MED ORDER — HYDROCODONE-ACETAMINOPHEN 5-325 MG PO TABS: ORAL_TABLET | ORAL | Status: DC

## 2015-10-03 MED ORDER — CYCLOBENZAPRINE HCL 5 MG PO TABS: ORAL_TABLET | ORAL | Status: DC

## 2015-10-03 NOTE — Progress Notes (Signed)
Subjective:  This chart was scribed for Lesle ChrisSteven Alessander Sikorski MD, by Veverly FellsHatice Demirci,scribe, at [facility].  This patient was seen in room 11 and the patient's care was started at 8:33 AM.   Chief Complaint  Patient presents with  . Back Pain    Started Yesterday     Patient ID: Jackson Sellers, male    DOB: 1945/06/20, 70 y.o.   MRN: [id number]  HPI  HPI Comments: Jackson Sellers is a 70 y.o. male with a history of diabetes who presents to the [facility] complaining of radiating back pain (right buttock) onset yesterday.  Patient was weeding in his yard a week ago (without pain) and once he went inside and sat down, he felt a "tweak" which began giving him pain with movement. He is now having difficulty standing up and sitting down.  Patient has had multiple forms of therapy for his back in the past which seemed to help him with his pain.  Since then, he has not had any back issues.  He denies any bladder or bowel incontinence, difficulty with urination or burning with urination.   Patient has seen a dermatologist regarding his moles and is willing to go back to have a recheck.   Patient had his back x-rayed last 4 years ago.    Patient Active Problem List   Diagnosis Date Noted  . Vertigo 04/12/2012  . Diabetes (HCC) 02/22/2012  . Hypertension 02/22/2012  . GERD (gastroesophageal reflux disease) 02/22/2012  . Hyperlipidemia 02/22/2012  . Hypothyroid 02/22/2012  . Arthritis 02/22/2012   Past Medical History  Diagnosis Date  . Diabetes mellitus without complication (HCC)   . Hypertension   . Arthritis   . Cataract   . GERD (gastroesophageal reflux disease)   . Thyroid disease    Past Surgical History  Procedure Laterality Date  . Hernia repair    . Eye surgery     Allergies  Allergen Reactions  . Metoclopramide Hcl Other (See Comments)    Pt states that he" felt like something was crawling all over me"  . Erythrocin Rash  .  Penicillins Rash    Has patient had a PCN reaction causing immediate rash, facial/tongue/throat swelling, SOB or lightheadedness with hypotension: No Has patient had a PCN reaction causing severe rash involving mucus membranes or skin necrosis: No Has patient had a PCN reaction that required hospitalization No Has patient had a PCN reaction occurring within the last 10 years:No If all of the above answers are "NO", then may proceed with Cephalosporin use.    Prior to Admission medications   Medication Sig Start Date End Date Taking? Authorizing Provider  aspirin 81 MG tablet Take 1 tablet (81 mg total) by mouth daily. 05/26/11  Yes Collene GobbleSteven A Zymeir Salminen, MD  atorvastatin (LIPITOR) 10 MG tablet Take 1 tablet (10 mg total) by mouth daily. 04/10/15  Yes Collene GobbleSteven A Kathlyn Leachman, MD  cholecalciferol (VITAMIN D) 1000 UNITS tablet Take 1 tablet (1,000 Units total) by mouth 2 (two) times daily. 05/26/11  Yes Collene GobbleSteven A Yalanda Soderman, MD  FLUoxetine (PROZAC) 20 MG tablet TAKE 1 TABLET BY MOUTH DAILY 04/10/15  Yes Collene GobbleSteven A Samanthan Dugo, MD  hydrochlorothiazide (HYDRODIURIL) 25 MG tablet Take 1 tablet (25 mg total) by mouth daily. 04/10/15  Yes Collene GobbleSteven A Layah Skousen, MD  levothyroxine (SYNTHROID, LEVOTHROID) 150 MCG tablet Take 1 tablet (150 mcg total) by mouth daily. 04/10/15  Yes Collene GobbleSteven A Jazon Jipson, MD  lisinopril (PRINIVIL,ZESTRIL) 20 MG  tablet Take 1 tablet (20 mg total) by mouth daily. 04/10/15  Yes Collene GobbleSteven A Monzerat Handler, MD  metFORMIN (GLUCOPHAGE) 500 MG tablet Take 1 tablet (500 mg total) by mouth 2 (two) times daily with a meal. 04/10/15  Yes Collene GobbleSteven A Dontel Harshberger, MD  HYDROcodone-acetaminophen (NORCO/VICODIN) 5-325 MG tablet Take 1 tablet by mouth every 4 (four) hours as needed. Patient not taking: Reported on 04/10/2015 02/23/15   Elpidio AnisShari Upstill, PA-C   Social History   Social History  . Marital Status: Married    Spouse Name: N/A  . Number of Children: N/A  . Years of Education: N/A   Occupational History  . Retired    Social History Main Topics  . Smoking  status: Never Smoker   . Smokeless tobacco: Never Used  . Alcohol Use: No  . Drug Use: No  . Sexual Activity: Yes   Other Topics Concern  . Not on file   Social History Narrative   Married   Education: College   Exercise: Yes       Review of Systems  Constitutional: Negative for fever and chills.  Eyes: Negative for pain, redness and itching.  Gastrointestinal: Negative for nausea and vomiting.  Genitourinary: Negative for dysuria, hematuria and difficulty urinating.  Musculoskeletal: Positive for back pain. Negative for neck pain and neck stiffness.  Neurological: Negative for seizures, syncope and speech difficulty.       Objective:   Physical Exam Filed Vitals:   10/03/15 0825  BP: 142/80  Pulse: 92  Temp: 97.5 F (36.4 C)  TempSrc: Oral  Resp: 20  Height: 5' 8.5" (1.74 m)  Weight: 262 lb (118.842 kg)  SpO2: 93%    CONSTITUTIONAL: Well developed/well nourished HEAD: Normocephalic/atraumatic EYES: EOMI/PERRL ENMT: Mucous membranes moist NECK: supple no meningeal signs SPINE/BACK: He has tenderness over the lower back but reflexes are 2+ and motor strength is 5/5.  CV: S1/S2 noted, no murmurs/rubs/gallops noted LUNGS: Lungs are clear to auscultation bilaterally, no apparent distress NEURO: Pt is awake/alert/appropriate, moves all extremitiesx4.  No facial droop.   EXTREMITIES: pulses normal/equal, full ROM SKIN: warm, color normal PSYCH: no abnormalities of mood noted, alert and oriented to situation  Results for orders placed or performed in visit on 10/03/15  POCT glucose (manual entry)  Result Value Ref Range   POC Glucose 134 (A) 70 - 99 mg/dl  POCT glycosylated hemoglobin (Hb A1C)  Result Value Ref Range   Hemoglobin A1C 6.7   No results found.       Assessment & Plan:  Hemoglobin A1c is down 0.3 points. I encouraged him to continue his exercise program and weight loss. Back films show only degenerative changes nothing severe. I did give him a  few hydrocodone for severe pain and encouraged him to take 1-2 Aleve twice a day. I also gave him Flexeril to have at night to help with the back stiffness.I personally performed the services described in this documentation, which was scribed in my presence. The recorded information has been reviewed and is accurate.  Collene GobbleSteven A Satomi Buda, MD

## 2015-10-03 NOTE — Patient Instructions (Addendum)
IF you received an x-ray today, you will receive an invoice from [facility]. Please contact [facility] at [phone] with questions or concerns regarding your invoice.   IF you received labwork today, you will receive an invoice from [facility]. Please contact [facility] at [phone] with questions or concerns regarding your invoice.   Our billing staff will not be able to assist you with questions regarding bills from these companies.  You will be contacted with the lab results as soon as they are available. The fastest way to get your results is to activate your My Chart account. Instructions are located on the last page of this paperwork. If you have not heard from us regarding the results in 2 weeks, please contact this office.    Back painBack Pain, Adult Back pain is very common in adults.The cause of back pain is rarely dangerous and the pain often gets better over time.The cause of your back pain may not be known. Some common causes of back pain include:  Strain of the muscles or ligaments supporting the spine.  Wear and tear (degeneration) of the spinal disks.  Arthritis.  Direct injury to the back. For many people, back pain may return. Since back pain is rarely dangerous, most people can learn to manage this condition on their own. HOME CARE INSTRUCTIONS Watch your back pain for any changes. The following actions may help to lessen any discomfort you are feeling:  Remain active. It is stressful on your back to sit or stand in one place for long periods of time. Do not sit, drive, or stand in one place for more than 30 minutes at a time. Take short walks on even surfaces as soon as you are able.Try to increase the length of time you walk each day.  Exercise regularly as directed by your health care provider. Exercise helps your back heal faster. It also helps avoid future injury by keeping your muscles strong and  flexible.  Do not stay in bed.Resting more than 1-2 days can delay your recovery.  Pay attention to your body when you bend and lift. The most comfortable positions are those that put less stress on your recovering back. Always use proper lifting techniques, including:  Bending your knees.  Keeping the load close to your body.  Avoiding twisting.  Find a comfortable position to sleep. Use a firm mattress and lie on your side with your knees slightly bent. If you lie on your back, put a pillow under your knees.  Avoid feeling anxious or stressed.Stress increases muscle tension and can worsen back pain.It is important to recognize when you are anxious or stressed and learn ways to manage it, such as with exercise.  Take medicines only as directed by your health care provider. Over-the-counter medicines to reduce pain and inflammation are often the most helpful.Your health care provider may prescribe muscle relaxant drugs.These medicines help dull your pain so you can more quickly return to your normal activities and healthy exercise.  Apply ice to the injured area:  Put ice in a plastic bag.  Place a towel between your skin and the bag.  Leave the ice on for 20 minutes, 2-3 times a day for the first 2-3 days. After that, ice and heat may be alternated to reduce pain and spasms.  Maintain a healthy weight. Excess weight puts extra stress on your back and makes it difficult to maintain good posture. SEEK MEDICAL CARE IF:  You have  pain that is not relieved with rest or medicine.  You have increasing pain going down into the legs or buttocks.  You have pain that does not improve in one week.  You have night pain.  You lose weight.  You have a fever or chills. SEEK IMMEDIATE MEDICAL CARE IF:   You develop new bowel or bladder control problems.  You have unusual weakness or numbness in your arms or legs.  You develop nausea or vomiting.  You develop abdominal  pain.  You feel faint.   This information is not intended to replace advice given to you by your health care provider. Make sure you discuss any questions you have with your health care provider.   Document Released: 03/15/2005 Document Revised: 04/05/2014 Document Reviewed: 07/17/2013 Elsevier Interactive Patient Education [employer]

## 2015-10-06 ENCOUNTER — Ambulatory Visit: Payer: Medicare Other | Admitting: Family Medicine

## 2015-10-08 ENCOUNTER — Ambulatory Visit: Payer: Medicare Other | Admitting: Family Medicine

## 2015-10-09 ENCOUNTER — Ambulatory Visit: Payer: Medicare Other | Admitting: Family Medicine

## 2016-04-09 ENCOUNTER — Ambulatory Visit (INDEPENDENT_AMBULATORY_CARE_PROVIDER_SITE_OTHER): Payer: Medicare Other

## 2016-04-09 ENCOUNTER — Ambulatory Visit (INDEPENDENT_AMBULATORY_CARE_PROVIDER_SITE_OTHER): Payer: Medicare Other | Admitting: Family Medicine

## 2016-04-09 VITALS — BP 156/66 | HR 104 | Temp 98.8°F | Resp 20 | Ht 68.5 in | Wt 266.4 lb

## 2016-04-09 DIAGNOSIS — M25561 Pain in right knee: Secondary | ICD-10-CM

## 2016-04-09 DIAGNOSIS — M1712 Unilateral primary osteoarthritis, left knee: Secondary | ICD-10-CM | POA: Diagnosis not present

## 2016-04-09 DIAGNOSIS — E119 Type 2 diabetes mellitus without complications: Secondary | ICD-10-CM | POA: Diagnosis not present

## 2016-04-09 DIAGNOSIS — M25562 Pain in left knee: Secondary | ICD-10-CM | POA: Diagnosis not present

## 2016-04-09 MED ORDER — DICLOFENAC SODIUM 1 % TD GEL: 4.0000 g | Freq: Four times a day (QID) | TRANSDERMAL | 1 refills | Status: DC

## 2016-04-09 MED ORDER — DICLOFENAC SODIUM 1 % TD GEL: 4.0000 g | Freq: Four times a day (QID) | TRANSDERMAL | Status: DC

## 2016-04-09 MED ORDER — HYDROCODONE-ACETAMINOPHEN 5-325 MG PO TABS: 1.0000 | ORAL_TABLET | Freq: Four times a day (QID) | ORAL | 0 refills | Status: DC | PRN

## 2016-04-09 NOTE — Patient Instructions (Addendum)
Your knee pain appears to be due to osteoarthritis or wear and tear of the knee. Occasionally this can also cause pain from a worn meniscus.   Initially try Tylenol over-the-counter, as well as topical anti-inflammatory cream that was prescribed.  This may be safer then anti-inflammatory by mouth with your history of diabetes. If needed for more severe pain, I did prescribe hydrocodone. Try 1/2-1 pill up to every 6 hours if needed for severe pain, but that medicine can cause dizziness and sedation.   If you're not improving in the next 2 weeks, return for recheck, sooner if worse. Over-the-counter brace is okay to use, and you can also try glucosamine chondroitin supplement over-the-counter to see if that helps.   Return to the clinic or go to the nearest emergency room if any of your symptoms worsen or new symptoms occur.   Knee Pain Knee pain is a very common symptom and can have many causes. Knee pain often goes away when you follow your health care provider's instructions for relieving pain and discomfort at home. However, knee pain can develop into a condition that needs treatment. Some conditions may include:  Arthritis caused by wear and tear (osteoarthritis).  Arthritis caused by swelling and irritation (rheumatoid arthritis or gout).  A cyst or growth in your knee.  An infection in your knee joint.  An injury that will not heal.  Damage, swelling, or irritation of the tissues that support your knee (torn ligaments or tendinitis). If your knee pain continues, additional tests may be ordered to diagnose your condition. Tests may include X-rays or other imaging studies of your knee. You may also need to have fluid removed from your knee. Treatment for ongoing knee pain depends on the cause, but treatment may include:  Medicines to relieve pain or swelling.  Steroid injections in your knee.  Physical therapy.  Surgery. HOME CARE INSTRUCTIONS  Take medicines only as directed  by your health care provider.  Rest your knee and keep it raised (elevated) while you are resting.  Do not do things that cause or worsen pain.  Avoid high-impact activities or exercises, such as running, jumping rope, or doing jumping jacks.  Apply ice to the knee area:  Put ice in a plastic bag.  Place a towel between your skin and the bag.  Leave the ice on for 20 minutes, 2-3 times a day.  Ask your health care provider if you should wear an elastic knee support.  Keep a pillow under your knee when you sleep.  Lose weight if you are overweight. Extra weight can put pressure on your knee.  Do not use any tobacco products, including cigarettes, chewing tobacco, or electronic cigarettes. If you need help quitting, ask your health care provider. Smoking may slow the healing of any bone and joint problems that you may have. SEEK MEDICAL CARE IF:  Your knee pain continues, changes, or gets worse.  You have a fever along with knee pain.  Your knee buckles or locks up.  Your knee becomes more swollen. SEEK IMMEDIATE MEDICAL CARE IF:   Your knee joint feels hot to the touch.  You have chest pain or trouble breathing. This information is not intended to replace advice given to you by your health care provider. Make sure you discuss any questions you have with your health care provider. Document Released: 01/10/2007 Document Revised: 04/05/2014 Document Reviewed: 10/29/2013 Elsevier Interactive Patient Education  2017 [employer].   IF you received an x-ray today,  you will receive an invoice from [facility]. Please contact [facility] at [phone] with questions or concerns regarding your invoice.   IF you received labwork today, you will receive an invoice from [facility]. Please contact [facility] at [phone] with questions or concerns regarding your invoice.   Our billing staff will not be able to assist you with questions regarding bills from these  companies.  You will be contacted with the lab results as soon as they are available. The fastest way to get your results is to activate your My Chart account. Instructions are located on the last page of this paperwork. If you have not heard from us regarding the results in 2 weeks, please contact this office.

## 2016-04-09 NOTE — Progress Notes (Addendum)
Subjective:  By signing my name below, I, Jackson Sellers, attest that this documentation has been prepared under the direction and in the presence of Jackson Staggers, MD. Electronically Signed: Stann Sellers, Scribe. 04/09/2016 , 6:17 PM .  Patient was seen in Room 12 .   Patient ID: Jackson Sellers, male    DOB: 05/07/1945, 71 y.o.   MRN: [id number] Chief Complaint  Patient presents with  . Knee Pain    Bilat. Knee pain, started with LT Knee 3 weeks ago -[facility]-   HPI Jackson Sellers is a 71 y.o. male Here for bilateral knee pain, that started with the left knee 3 weeks ago. He is a previous patient of Dr. Cleta Sellers.   Patient reports his left knee started hurting about a week before Christmas (about 3 weeks ago). He states it initially felt stiff, and then slowly progressively gotten really painful, mostly behind his knee. His right knee pain started about 2~3 days ago. He's felt sore and feels like his knees will give way, but no mechanical symptoms. He's taken 1 aleve without relief. He's also been wearing a knee brace for his left knee at night for some relief for about 2 weeks. He's also tried applying blue emu cream over his knees at night for some relief. He hasn't applied heat or ice over the area. He's been utilizing a quad base cane to assist his walking. He denies any swelling in his knees. He denies any falls or change in activity. He does note taking a little longer with soreness when he first changes his position from sitting.   He reports stepping into a hole and hurting his left knee with a small tear about 10~15 years ago. He denies history of blood clots. He denies any recent long car rides or air travel. He has occasional pain in his left calf when he walks, but no swelling in his calves.   He plans to go on a cruise on Jan 27th.   Patient Active Problem List   Diagnosis Date Noted  . Vertigo 04/12/2012  . Diabetes (HCC) 02/22/2012  . Hypertension 02/22/2012  . GERD  (gastroesophageal reflux disease) 02/22/2012  . Hyperlipidemia 02/22/2012  . Hypothyroid 02/22/2012  . Arthritis 02/22/2012   Past Medical History:  Diagnosis Date  . Arthritis   . Cataract   . Diabetes mellitus without complication (HCC)   . GERD (gastroesophageal reflux disease)   . Hypertension   . Thyroid disease    Past Surgical History:  Procedure Laterality Date  . EYE SURGERY    . HERNIA REPAIR     Allergies  Allergen Reactions  . Metoclopramide Hcl Other (See Comments)    Pt states that he" felt like something was crawling all over me"  . Erythrocin Rash  . Penicillins Rash    Has patient had a PCN reaction causing immediate rash, facial/tongue/throat swelling, SOB or lightheadedness with hypotension: No Has patient had a PCN reaction causing severe rash involving mucus membranes or skin necrosis: No Has patient had a PCN reaction that required hospitalization No Has patient had a PCN reaction occurring within the last 10 years:No If all of the above answers are "NO", then may proceed with Cephalosporin use.    Prior to Admission medications   Medication Sig Start Date End Date Taking? Authorizing Provider  aspirin 81 MG tablet Take 1 tablet (81 mg total) by mouth daily. 05/26/11  Yes Collene Gobble, MD  atorvastatin (LIPITOR) 10 MG tablet Take  1 tablet (10 mg total) by mouth daily. 04/10/15  Yes Collene GobbleSteven A Daub, MD  cholecalciferol (VITAMIN D) 1000 UNITS tablet Take 1 tablet (1,000 Units total) by mouth 2 (two) times daily. 05/26/11  Yes Collene GobbleSteven A Daub, MD  FLUoxetine (PROZAC) 20 MG tablet TAKE 1 TABLET BY MOUTH DAILY 04/10/15  Yes Collene GobbleSteven A Daub, MD  hydrochlorothiazide (HYDRODIURIL) 25 MG tablet Take 1 tablet (25 mg total) by mouth daily. 04/10/15  Yes Collene GobbleSteven A Daub, MD  levothyroxine (SYNTHROID, LEVOTHROID) 150 MCG tablet Take 1 tablet (150 mcg total) by mouth daily. 04/10/15  Yes Collene GobbleSteven A Daub, MD  lisinopril (PRINIVIL,ZESTRIL) 20 MG tablet Take 1 tablet (20 mg total) by  mouth daily. 04/10/15  Yes Collene GobbleSteven A Daub, MD  metFORMIN (GLUCOPHAGE) 500 MG tablet Take 1 tablet (500 mg total) by mouth 2 (two) times daily with a meal. 04/10/15  Yes Collene GobbleSteven A Daub, MD  cyclobenzaprine (FLEXERIL) 5 MG tablet Take 1 tablet at night for severe spasm Patient not taking: Reported on 04/09/2016 10/03/15   Collene GobbleSteven A Daub, MD   Social History   Social History  . Marital status: Married    Spouse name: N/A  . Number of children: N/A  . Years of education: N/A   Occupational History  . Retired    Social History Main Topics  . Smoking status: Never Smoker  . Smokeless tobacco: Never Used  . Alcohol use No  . Drug use: No  . Sexual activity: Yes   Other Topics Concern  . Not on file   Social History Narrative   Married   Education: College   Exercise: Yes   Review of Systems  Constitutional: Negative for activity change, fatigue and unexpected weight change.  Eyes: Negative for visual disturbance.  Respiratory: Negative for cough, chest tightness and shortness of breath.   Cardiovascular: Negative for chest pain, palpitations and leg swelling.  Gastrointestinal: Negative for abdominal pain and blood in stool.  Musculoskeletal: Positive for arthralgias, gait problem and myalgias. Negative for joint swelling.  Neurological: Negative for dizziness, weakness, light-headedness, numbness and headaches.       Objective:   Physical Exam  Constitutional: He is oriented to person, place, and time. He appears well-developed and well-nourished. No distress.  HENT:  Head: Normocephalic and atraumatic.  Eyes: EOM are normal. Pupils are equal, round, and reactive to light.  Neck: Neck supple. No JVD present. Carotid bruit is not present.  Cardiovascular: Normal rate, regular rhythm and normal heart sounds.   No murmur heard. Pulmonary/Chest: Effort normal and breath sounds normal. No respiratory distress. He has no rales.  Musculoskeletal: Normal range of motion. He exhibits no  edema.  Right knee: skin intact, no effusion, no erythema, minimal jointline tenderness medially, patellar tendon non tender, full ROM, negative varus, negative valgus, somewhat guarded Lachman but negative, negative mcmurray Left knee: skin intact, no effusion, no erythema, minimal patellar tenderness, tender along lateral jointline, full ROM, negative varus, negative valgus, negative Lachman, some crepitous with mcmurray but not painful; calf is non tender, no apparent calf swelling Circumference 15cm below patellar, mostly equal to right side, 38cm on left, 39cm on right  Neurological: He is alert and oriented to person, place, and time.  Skin: Skin is warm and dry.  Psychiatric: He has a normal mood and affect. His behavior is normal.  Nursing note and vitals reviewed.   Vitals:   04/09/16 1652  BP: (!) 156/66  Pulse: (!) 104  Resp: 20  Temp: 98.8  F (37.1 C)  TempSrc: Oral  SpO2: 97%  Weight: 266 lb 6.4 oz (120.8 kg)  Height: 5' 8.5" (1.74 m)   Dg Knee Complete 4 Views Left  Result Date: 04/09/2016 CLINICAL DATA:  Left knee pain for 3 weeks. EXAM: LEFT KNEE - COMPLETE 4+ VIEW COMPARISON:  None. FINDINGS: Tricompartmental spurring and moderate degenerative chondral thinning. No definite knee effusion on the lateral projection. Vascular calcifications noted. Patellofemoral compartmental thinning is more notable medially. IMPRESSION: 1. Moderate osteoarthritis of the knee without knee effusion or definite acute findings. Electronically Signed   By: Gaylyn Rong M.D.   On: 04/09/2016 18:32       Assessment & Plan:  VARICK KEYS is a 71 y.o. male Acute pain of left knee - Plan: DG Knee Complete 4 Views Left, HYDROcodone-acetaminophen (NORCO/VICODIN) 5-325 MG tablet, DISCONTINUED: diclofenac sodium (VOLTAREN) 1 % transdermal gel 4 g  Acute pain of right knee - Plan: HYDROcodone-acetaminophen (NORCO/VICODIN) 5-325 MG tablet  Osteoarthritis of left knee, unspecified  osteoarthritis type - Plan: DISCONTINUED: diclofenac sodium (VOLTAREN) 1 % transdermal gel 4 g  Type 2 diabetes mellitus without complication, without long-term current use of insulin (HCC)   Suspected osteoarthritis flare of left knee with favoring of right knee as cause of pain on that side. No apparent effusion, no instability on exam. Initial trial of topical diclofenac up to 4 g 4 times a day given history of diabetes, hypertension and risk for cardiac disease with oral NSAIDs.. Additionally can take Tylenol over-the-counter, glucosamine/chondroitin over-the-counter, continue bracing. If increased pain, hydrocodone was prescribed with side effects discussed.   - recheck in 2 weeks if not improving, sooner if worse.   No orders of the defined types were placed in this encounter.  Patient Instructions        IF you received an x-ray today, you will receive an invoice from [facility]. Please contact [facility] at [phone] with questions or concerns regarding your invoice.   IF you received labwork today, you will receive an invoice from [facility]. Please contact [facility] at [phone] with questions or concerns regarding your invoice.   Our billing staff will not be able to assist you with questions regarding bills from these companies.  You will be contacted with the lab results as soon as they are available. The fastest way to get your results is to activate your My Chart account. Instructions are located on the last page of this paperwork. If you have not heard from [place] regarding the results in 2 weeks, please contact this office.        I personally performed the services described in this documentation, which was scribed in my presence. The recorded information has been reviewed and considered, and addended by me as needed.   Signed,   Jackson Staggers, MD [facility] at [facility].  04/09/16 6:40 PM

## 2016-06-06 ENCOUNTER — Other Ambulatory Visit: Payer: Self-pay | Admitting: Emergency Medicine

## 2016-06-06 DIAGNOSIS — E039 Hypothyroidism, unspecified: Secondary | ICD-10-CM

## 2016-06-06 DIAGNOSIS — I10 Essential (primary) hypertension: Secondary | ICD-10-CM

## 2016-06-06 DIAGNOSIS — E785 Hyperlipidemia, unspecified: Secondary | ICD-10-CM

## 2016-07-01 ENCOUNTER — Other Ambulatory Visit: Payer: Self-pay | Admitting: Emergency Medicine

## 2016-07-03 ENCOUNTER — Other Ambulatory Visit: Payer: Self-pay | Admitting: Family Medicine

## 2016-07-03 ENCOUNTER — Other Ambulatory Visit: Payer: Self-pay | Admitting: Emergency Medicine

## 2016-07-03 DIAGNOSIS — E039 Hypothyroidism, unspecified: Secondary | ICD-10-CM

## 2016-07-03 DIAGNOSIS — E785 Hyperlipidemia, unspecified: Secondary | ICD-10-CM

## 2016-07-03 DIAGNOSIS — I10 Essential (primary) hypertension: Secondary | ICD-10-CM

## 2016-07-03 NOTE — Telephone Encounter (Signed)
Last seen 03/2016

## 2016-07-13 ENCOUNTER — Other Ambulatory Visit: Payer: Self-pay

## 2016-09-06 ENCOUNTER — Other Ambulatory Visit: Payer: Self-pay | Admitting: Family Medicine

## 2016-09-06 DIAGNOSIS — E039 Hypothyroidism, unspecified: Secondary | ICD-10-CM

## 2016-09-06 DIAGNOSIS — I10 Essential (primary) hypertension: Secondary | ICD-10-CM

## 2016-09-06 DIAGNOSIS — E785 Hyperlipidemia, unspecified: Secondary | ICD-10-CM

## 2016-09-09 NOTE — Telephone Encounter (Signed)
Pt's last labs were 09/2015. IC pt - made appt for Dr. Neva SeatGreene Will refill meds: Lisinopril, Atorvastatin, Levothyroxine, Metformin  At that time.  Refused meds per Surescripts at this time.

## 2016-09-11 ENCOUNTER — Encounter: Payer: Self-pay | Admitting: Family Medicine

## 2016-09-11 ENCOUNTER — Ambulatory Visit (INDEPENDENT_AMBULATORY_CARE_PROVIDER_SITE_OTHER): Payer: Medicare Other | Admitting: Family Medicine

## 2016-09-11 VITALS — BP 143/66 | HR 96 | Temp 98.2°F | Resp 16 | Ht 68.0 in | Wt 269.6 lb

## 2016-09-11 DIAGNOSIS — I10 Essential (primary) hypertension: Secondary | ICD-10-CM

## 2016-09-11 DIAGNOSIS — F411 Generalized anxiety disorder: Secondary | ICD-10-CM

## 2016-09-11 DIAGNOSIS — E119 Type 2 diabetes mellitus without complications: Secondary | ICD-10-CM

## 2016-09-11 DIAGNOSIS — E785 Hyperlipidemia, unspecified: Secondary | ICD-10-CM

## 2016-09-11 DIAGNOSIS — E039 Hypothyroidism, unspecified: Secondary | ICD-10-CM

## 2016-09-11 MED ORDER — LEVOTHYROXINE SODIUM 150 MCG PO TABS: 150.0000 ug | ORAL_TABLET | Freq: Every day | ORAL | 1 refills | Status: DC

## 2016-09-11 MED ORDER — FLUOXETINE HCL 20 MG PO TABS: 20.0000 mg | ORAL_TABLET | Freq: Every day | ORAL | 1 refills | Status: DC

## 2016-09-11 MED ORDER — METFORMIN HCL 500 MG PO TABS: ORAL_TABLET | ORAL | 1 refills | Status: DC

## 2016-09-11 MED ORDER — LISINOPRIL 20 MG PO TABS: 20.0000 mg | ORAL_TABLET | Freq: Every day | ORAL | 1 refills | Status: DC

## 2016-09-11 MED ORDER — HYDROCHLOROTHIAZIDE 25 MG PO TABS: 25.0000 mg | ORAL_TABLET | Freq: Every day | ORAL | 1 refills | Status: DC

## 2016-09-11 MED ORDER — ATORVASTATIN CALCIUM 10 MG PO TABS: 10.0000 mg | ORAL_TABLET | Freq: Every day | ORAL | 1 refills | Status: DC

## 2016-09-11 NOTE — Patient Instructions (Addendum)
Return next week for fasting lab visit only. You do not need an appointment.   Follow up with me in next 3-6 months for physical.   Diabetes Mellitus and Standards of Medical Care Managing diabetes (diabetes mellitus) can be complicated. Your diabetes treatment may be managed by a team of health care providers, including:  A diet and nutrition specialist (registered dietitian).  A nurse.  A certified diabetes educator (CDE).  A diabetes specialist (endocrinologist).  An eye doctor.  A primary care provider.  A dentist.  Your health care providers follow a schedule in order to help you get the best quality of care. The following schedule is a general guideline for your diabetes management plan. Your health care providers may also give you more specific instructions. HbA1c ( hemoglobin A1c) test This test provides information about blood sugar (glucose) control over the previous 2-3 months. It is used to check whether your diabetes management plan needs to be adjusted.  If you are meeting your treatment goals, this test is done at least 2 times a year.  If you are not meeting treatment goals or if your treatment goals have changed, this test is done 4 times a year.  Blood pressure test  This test is done at every routine medical visit. For most people, the goal is less than 130/80. Ask your health care provider what your goal blood pressure should be. Dental and eye exams  Visit your dentist two times a year.  If you have type 1 diabetes, get an eye exam 3-5 years after you are diagnosed, and then once a year after your first exam. ? If you were diagnosed with type 1 diabetes as a child, get an eye exam when you are age 53 or older and have had diabetes for 3-5 years. After the first exam, you should get an eye exam once a year.  If you have type 2 diabetes, have an eye exam as soon as you are diagnosed, and then once a year after your first exam. Foot care exam  Visual foot  exams are done at every routine medical visit. The exams check for cuts, bruises, redness, blisters, sores, or other problems with the feet.  A complete foot exam is done by your health care provider once a year. This exam includes an inspection of the structure and skin of your feet, and a check of the pulses and sensation in your feet. ? Type 1 diabetes: Get your first exam 3-5 years after diagnosis. ? Type 2 diabetes: Get your first exam as soon as you are diagnosed.  Check your feet every day for cuts, bruises, redness, blisters, or sores. If you have any of these or other problems that are not healing, contact your health care provider. Kidney function test ( urine microalbumin)  This test is done once a year. ? Type 1 diabetes: Get your first test 5 years after diagnosis. ? Type 2 diabetes: Get your first test as soon as you are diagnosed.  If you have chronic kidney disease (CKD), get a serum creatinine and estimated glomerular filtration rate (eGFR) test once a year. Lipid profile (cholesterol, HDL, LDL, triglycerides)  This test should be done when you are diagnosed with diabetes, and every 5 years after the first test. If you are on medicines to lower your cholesterol, you may need to get this test done every year. ? The goal for LDL is less than 100 mg/dL (5.5 mmol/L). If you are at high risk, the  goal is less than 70 mg/dL (3.9 mmol/L). ? The goal for HDL is 40 mg/dL (2.2 mmol/L) for men and 50 mg/dL(2.8 mmol/L) for women. An HDL cholesterol of 60 mg/dL (3.3 mmol/L) or higher gives some protection against heart disease. ? The goal for triglycerides is less than 150 mg/dL (8.3 mmol/L). Immunizations  The yearly flu (influenza) vaccine is recommended for everyone 6 months or older who has diabetes.  The pneumonia (pneumococcal) vaccine is recommended for everyone 2 years or older who has diabetes. If you are 63 or older, you may get the pneumonia vaccine as a series of two separate  shots.  The hepatitis B vaccine is recommended for adults shortly after they have been diagnosed with diabetes.  The Tdap (tetanus, diphtheria, and pertussis) vaccine should be given: ? According to normal childhood vaccination schedules, for children. ? Every 10 years, for adults who have diabetes.  The shingles vaccine is recommended for people who have had chicken pox and are 50 years or older. Mental and emotional health  Screening for symptoms of eating disorders, anxiety, and depression is recommended at the time of diagnosis and afterward as needed. If your screening shows that you have symptoms (you have a positive screening result), you may need further evaluation and be referred to a mental health care provider. Diabetes self-management education  Education about how to manage your diabetes is recommended at diagnosis and ongoing as needed. Treatment plan  Your treatment plan will be reviewed at every medical visit. Summary  Managing diabetes (diabetes mellitus) can be complicated. Your diabetes treatment may be managed by a team of health care providers.  Your health care providers follow a schedule in order to help you get the best quality of care.  Standards of care including having regular physical exams, blood tests, blood pressure monitoring, immunizations, screening tests, and education about how to manage your diabetes.  Your health care providers may also give you more specific instructions based on your individual health. This information is not intended to replace advice given to you by your health care provider. Make sure you discuss any questions you have with your health care provider. Document Released: 01/10/2009 Document Revised: 12/12/2015 Document Reviewed: 12/12/2015 Elsevier Interactive Patient Education  2018 [employer].   IF you received an x-ray today, you will receive an invoice from [facility]. Please contact [facility] at  [phone] with questions or concerns regarding your invoice.   IF you received labwork today, you will receive an invoice from [place]. Please contact [facility] at [phone] with questions or concerns regarding your invoice.   Our billing staff will not be able to assist you with questions regarding bills from these companies.  You will be contacted with the lab results as soon as they are available. The fastest way to get your results is to activate your My Chart account. Instructions are located on the last page of this paperwork. If you have not heard from [place] regarding the results in 2 weeks, please contact this office.

## 2016-09-11 NOTE — Progress Notes (Signed)
By signing my name below, I, Mesha Guinyard, attest that this documentation has been prepared under the direction and in the presence of Merri Ray, MD.  Electronically Signed: Verlee Monte, Medical Scribe. 09/11/16. 2:51 PM.  Subjective:    Patient ID: Jackson Sellers, male    DOB: 1945-07-04, 71 y.o.   MRN: [id number]  HPI Chief Complaint  Patient presents with  . Diabetes  . Gastroesophageal Reflux  . Hypertension  . Hypothyroidism    HPI Comments: Jackson Sellers is a 71 y.o. male who presents to Primary Care at [facility] for follow-up. Previous pt of Dr. Everlene Farrier. Pt is not fasting.  DM: He takes metformin 500 mg BID. Pt is compliant with metformin, but he hasn't taken it today due to running out. Pt's last ophthalmologist appt was last year, and his next appt is next year. He has appts every 2 years. He is also followed by a dentist. Pt tries to walk for exercise, but his foot and knee pain prevents him. He is trying to go to a water aerobics class for exercise. Denies diabetic retinopathy, diabetic changes in his eyes, or any complications with his DM. Denies abdominal pain, diarrhea, or other acute sxs. Lab Results  Component Value Date   HGBA1C 6.7 10/03/2015   Lab Results  Component Value Date   MICROALBUR 0.3 04/10/2015   Immunization History  Administered Date(s) Administered  . Influenza Split 02/27/2013  . Influenza, Seasonal, Injecte, Preservative Fre 02/22/2012  . Influenza,inj,Quad PF,36+ Mos 12/18/2013  . Influenza-Unspecified 10[birth year]15-[birth year], 02/18/2015  . Pneumococcal Conjugate-13 12/18/2013  . Pneumococcal Polysaccharide-23 07/01/2009  . Td 05/30/2008   Wt Readings from Last 3 Encounters:  09/11/16 269 lb 9.6 oz (122.3 kg)  04/09/16 266 lb 6.4 oz (120.8 kg)  10/03/15 262 lb (118.8 kg)  Body mass index is 40.99 kg/m.  HLD: He takes lipitor 10 mg QD. Pt is complaint with lipitor and denies myalgias, arthralgias, negative side effects, or other acute  sxs. Lab Results  Component Value Date   CHOL 200 04/10/2015   HDL 40 04/10/2015   LDLCALC 85 04/10/2015   TRIG 376 (H) 04/10/2015   CHOLHDL 5.0 04/10/2015   Lab Results  Component Value Date   ALT 11 04/10/2015   AST 13 04/10/2015   ALKPHOS 52 04/10/2015   BILITOT 0.7 04/10/2015   HTN: He takes HCTZ 25 mg QD, lisinopril 20 mg QD. Pt is compliant with his medications, but he has been off of his medications for a couple of days. Reports dizziness but notes he has a inner ear problem. Denies light-headedness, chest pain, SOB, negative side effects, or acute sxs. Lab Results  Component Value Date   CREATININE 1.21 04/10/2015   BP Readings from Last 3 Encounters:  09/11/16 (!) 143/66  04/09/16 (!) 156/66  10/03/15 (!) 142/80   Hypothyroidism: He had lower TSH few years prior but stable on visit Jan 2017. He takes synthroid 150 mcg QD. Lab Results  Component Value Date   TSH 0.515 04/10/2015   GERD: Pt is no longer taking medication for GERD. He had surgery about 10 years ago and he hasn't had to take medication since.  Anxiety: Takes prozac 20 mg QD. Pt is complaint with prozac and hasn't had negative side effects while on it. Depression screen [facility] 2/9 09/11/2016 04/09/2016 10/03/2015 04/10/2015 12/18/2013  Decreased Interest 0 0 0 0 0  Down, Depressed, Hopeless 0 0 0 0 0  PHQ - 2 Score 0 0 0 0  0   Patient Active Problem List   Diagnosis Date Noted  . Vertigo 04/12/2012  . Diabetes ([place]) 02/22/2012  . Hypertension 02/22/2012  . GERD (gastroesophageal reflux disease) 02/22/2012  . Hyperlipidemia 02/22/2012  . Hypothyroid 02/22/2012  . Arthritis 02/22/2012   Past Medical History:  Diagnosis Date  . Arthritis   . Cataract   . Diabetes mellitus without complication ([place])   . GERD (gastroesophageal reflux disease)   . Hypertension   . Thyroid disease    Past Surgical History:  Procedure Laterality Date  . EYE SURGERY    . HERNIA REPAIR     Allergies  Allergen  Reactions  . Metoclopramide Hcl Other (See Comments)    Pt states that he" felt like something was crawling all over me"  . Erythrocin Rash  . Penicillins Rash    Has patient had a PCN reaction causing immediate rash, facial/tongue/throat swelling, SOB or lightheadedness with hypotension: No Has patient had a PCN reaction causing severe rash involving mucus membranes or skin necrosis: No Has patient had a PCN reaction that required hospitalization No Has patient had a PCN reaction occurring within the last 10 years:No If all of the above answers are "NO", then may proceed with Cephalosporin use.    Prior to Admission medications   Medication Sig Start Date End Date Taking? Authorizing Provider  aspirin 81 MG tablet Take 1 tablet (81 mg total) by mouth daily. 05/26/11  Yes Darlyne Russian, MD  atorvastatin (LIPITOR) 10 MG tablet TAKE 1 TABLET BY MOUTH EVERY DAY 07/03/16  Yes Wendie Agreste, MD  cholecalciferol (VITAMIN D) 1000 UNITS tablet Take 1 tablet (1,000 Units total) by mouth 2 (two) times daily. 05/26/11  Yes Darlyne Russian, MD  FLUoxetine (PROZAC) 20 MG tablet TAKE 1 TABLET BY MOUTH EVERY DAY 07/02/16  Yes Wendie Agreste, MD  hydrochlorothiazide (HYDRODIURIL) 25 MG tablet TAKE 1 TABLET BY MOUTH EVERY DAY 06/06/16  Yes Wendie Agreste, MD  levothyroxine (SYNTHROID, LEVOTHROID) 150 MCG tablet TAKE 1 TABLET BY MOUTH EVERY DAY 07/03/16  Yes Wendie Agreste, MD  lisinopril (PRINIVIL,ZESTRIL) 20 MG tablet TAKE 1 TABLET BY MOUTH EVERY DAY 07/03/16  Yes Wendie Agreste, MD  metFORMIN (GLUCOPHAGE) 500 MG tablet TAKE 1 TABLET BY MOUTH TWICE A DAY WITH A MEAL 07/03/16  Yes Wendie Agreste, MD   Social History   Social History  . Marital status: Married    Spouse name: N/A  . Number of children: N/A  . Years of education: N/A   Occupational History  . Retired    Social History Main Topics  . Smoking status: Never Smoker  . Smokeless tobacco: Never Used  . Alcohol use No  . Drug use:  No  . Sexual activity: Yes   Other Topics Concern  . Not on file   Social History Narrative   Married   Education: College   Exercise: Yes   Review of Systems  Respiratory: Negative for shortness of breath.   Cardiovascular: Negative for chest pain.  Gastrointestinal: Negative for abdominal pain, diarrhea and nausea.  Musculoskeletal: Positive for arthralgias. Negative for myalgias.  Neurological: Positive for dizziness (inner ear problems). Negative for light-headedness.  Psychiatric/Behavioral: The patient is not nervous/anxious (controlled with medication).    Objective:  Physical Exam  Constitutional: He appears well-developed and well-nourished. No distress.  HENT:  Head: Normocephalic and atraumatic.  Eyes: Conjunctivae are normal.  Neck: Neck supple.  Cardiovascular: Normal rate, regular rhythm and normal  heart sounds.  Exam reveals no gallop and no friction rub.   No murmur heard. Pulmonary/Chest: Effort normal and breath sounds normal. No respiratory distress. He has no wheezes. He has no rales.  Abdominal: There is no tenderness.  Neurological: He is alert.  Skin: Skin is warm and dry.  Psychiatric: He has a normal mood and affect. His behavior is normal.  Nursing note and vitals reviewed.   Vitals:   09/11/16 1412  BP: (!) 143/66  Pulse: 96  Resp: 16  Temp: 98.2 F (36.8 C)  TempSrc: Oral  SpO2: 98%  Weight: 269 lb 9.6 oz (122.3 kg)  Height: '5\' 8"'  (1.727 m)   Body mass index is 40.99 kg/m. Assessment & Plan:    LEXANDER TREMBLAY is a 70 y.o. male Type 2 diabetes mellitus without complication, without long-term current use of insulin ([place]) - Plan: Hemoglobin A1c, metFORMIN (GLUCOPHAGE) 500 MG tablet  - Check A1c, tolerating metformin, continue same dose for now.  -Continue to watch diet, exercise for weight loss. Water exercise may be easier.  Essential hypertension - Plan: lisinopril (PRINIVIL,ZESTRIL) 20 MG tablet, hydrochlorothiazide (HYDRODIURIL)  25 MG tablet  -Tolerating current medication, but off medication for a few days, likely reflecting current elevation. We'll continue same doses of previous medications, monitor home readings, recheck for physical within the next 3-6 months depending on A1c.  Hyperlipidemia, unspecified hyperlipidemia type - Plan: Comprehensive metabolic panel, Lipid panel, atorvastatin (LIPITOR) 10 MG tablet  -Tolerating statin, check lipid panel, CMP. Continue same dose Lipitor  Hypothyroidism, unspecified type - Plan: TSH, levothyroxine (SYNTHROID, LEVOTHROID) 150 MCG tablet  -Check TSH, continue same dose of Synthroid for now.  Anxiety state - Plan: FLUoxetine (PROZAC) 20 MG tablet  -Reports stable symptoms. Continue Prozac 20 mg daily.  Meds ordered this encounter  Medications  . metFORMIN (GLUCOPHAGE) 500 MG tablet    Sig: TAKE 1 TABLET BY MOUTH TWICE A DAY WITH A MEAL    Dispense:  180 tablet    Refill:  1  . lisinopril (PRINIVIL,ZESTRIL) 20 MG tablet    Sig: Take 1 tablet (20 mg total) by mouth daily.    Dispense:  90 tablet    Refill:  1  . levothyroxine (SYNTHROID, LEVOTHROID) 150 MCG tablet    Sig: Take 1 tablet (150 mcg total) by mouth daily.    Dispense:  90 tablet    Refill:  1  . hydrochlorothiazide (HYDRODIURIL) 25 MG tablet    Sig: Take 1 tablet (25 mg total) by mouth daily.    Dispense:  90 tablet    Refill:  1  . FLUoxetine (PROZAC) 20 MG tablet    Sig: Take 1 tablet (20 mg total) by mouth daily.    Dispense:  90 tablet    Refill:  1  . atorvastatin (LIPITOR) 10 MG tablet    Sig: Take 1 tablet (10 mg total) by mouth daily.    Dispense:  90 tablet    Refill:  1   Patient Instructions   Return next week for fasting lab visit only. You do not need an appointment.   Follow up with me in next 3-6 months for physical.   Diabetes Mellitus and Standards of Medical Care Managing diabetes (diabetes mellitus) can be complicated. Your diabetes treatment may be managed by a team of  health care providers, including:  A diet and nutrition specialist (registered dietitian).  A nurse.  A certified diabetes educator (CDE).  A diabetes specialist (endocrinologist).  An  eye doctor.  A primary care provider.  A dentist.  Your health care providers follow a schedule in order to help you get the best quality of care. The following schedule is a general guideline for your diabetes management plan. Your health care providers may also give you more specific instructions. HbA1c ( hemoglobin A1c) test This test provides information about blood sugar (glucose) control over the previous 2-3 months. It is used to check whether your diabetes management plan needs to be adjusted.  If you are meeting your treatment goals, this test is done at least 2 times a year.  If you are not meeting treatment goals or if your treatment goals have changed, this test is done 4 times a year.  Blood pressure test  This test is done at every routine medical visit. For most people, the goal is less than 130/80. Ask your health care provider what your goal blood pressure should be. Dental and eye exams  Visit your dentist two times a year.  If you have type 1 diabetes, get an eye exam 3-5 years after you are diagnosed, and then once a year after your first exam. ? If you were diagnosed with type 1 diabetes as a child, get an eye exam when you are age 75 or older and have had diabetes for 3-5 years. After the first exam, you should get an eye exam once a year.  If you have type 2 diabetes, have an eye exam as soon as you are diagnosed, and then once a year after your first exam. Foot care exam  Visual foot exams are done at every routine medical visit. The exams check for cuts, bruises, redness, blisters, sores, or other problems with the feet.  A complete foot exam is done by your health care provider once a year. This exam includes an inspection of the structure and skin of your feet, and a  check of the pulses and sensation in your feet. ? Type 1 diabetes: Get your first exam 3-5 years after diagnosis. ? Type 2 diabetes: Get your first exam as soon as you are diagnosed.  Check your feet every day for cuts, bruises, redness, blisters, or sores. If you have any of these or other problems that are not healing, contact your health care provider. Kidney function test ( urine microalbumin)  This test is done once a year. ? Type 1 diabetes: Get your first test 5 years after diagnosis. ? Type 2 diabetes: Get your first test as soon as you are diagnosed.  If you have chronic kidney disease (CKD), get a serum creatinine and estimated glomerular filtration rate (eGFR) test once a year. Lipid profile (cholesterol, HDL, LDL, triglycerides)  This test should be done when you are diagnosed with diabetes, and every 5 years after the first test. If you are on medicines to lower your cholesterol, you may need to get this test done every year. ? The goal for LDL is less than 100 mg/dL (5.5 mmol/L). If you are at high risk, the goal is less than 70 mg/dL (3.9 mmol/L). ? The goal for HDL is 40 mg/dL (2.2 mmol/L) for men and 50 mg/dL(2.8 mmol/L) for women. An HDL cholesterol of 60 mg/dL (3.3 mmol/L) or higher gives some protection against heart disease. ? The goal for triglycerides is less than 150 mg/dL (8.3 mmol/L). Immunizations  The yearly flu (influenza) vaccine is recommended for everyone 6 months or older who has diabetes.  The pneumonia (pneumococcal) vaccine is recommended for  everyone 2 years or older who has diabetes. If you are 10 or older, you may get the pneumonia vaccine as a series of two separate shots.  The hepatitis B vaccine is recommended for adults shortly after they have been diagnosed with diabetes.  The Tdap (tetanus, diphtheria, and pertussis) vaccine should be given: ? According to normal childhood vaccination schedules, for children. ? Every 10 years, for adults who  have diabetes.  The shingles vaccine is recommended for people who have had chicken pox and are 50 years or older. Mental and emotional health  Screening for symptoms of eating disorders, anxiety, and depression is recommended at the time of diagnosis and afterward as needed. If your screening shows that you have symptoms (you have a positive screening result), you may need further evaluation and be referred to a mental health care provider. Diabetes self-management education  Education about how to manage your diabetes is recommended at diagnosis and ongoing as needed. Treatment plan  Your treatment plan will be reviewed at every medical visit. Summary  Managing diabetes (diabetes mellitus) can be complicated. Your diabetes treatment may be managed by a team of health care providers.  Your health care providers follow a schedule in order to help you get the best quality of care.  Standards of care including having regular physical exams, blood tests, blood pressure monitoring, immunizations, screening tests, and education about how to manage your diabetes.  Your health care providers may also give you more specific instructions based on your individual health. This information is not intended to replace advice given to you by your health care provider. Make sure you discuss any questions you have with your health care provider. Document Released: 01/10/2009 Document Revised: 12/12/2015 Document Reviewed: 12/12/2015 Elsevier Interactive Patient Education  2018 [employer].   IF you received an x-ray today, you will receive an invoice from [facility]. Please contact [facility] at [phone] with questions or concerns regarding your invoice.   IF you received labwork today, you will receive an invoice from [facility]. Please contact [facility] at [phone] with questions or concerns regarding your invoice.   Our billing staff will not be able to assist you with  questions regarding bills from these companies.  You will be contacted with the lab results as soon as they are available. The fastest way to get your results is to activate your My Chart account. Instructions are located on the last page of this paperwork. If you have not heard from [place] regarding the results in 2 weeks, please contact this office.      I personally performed the services described in this documentation, which was scribed in my presence. The recorded information has been reviewed and considered for accuracy and completeness, addended by me as needed, and agree with information above.  Signed,   Merri Ray, MD [facility].  09/12/16 5:32 PM

## 2017-03-03 ENCOUNTER — Other Ambulatory Visit: Payer: Self-pay | Admitting: Family Medicine

## 2017-03-03 DIAGNOSIS — E039 Hypothyroidism, unspecified: Secondary | ICD-10-CM

## 2017-03-03 DIAGNOSIS — E119 Type 2 diabetes mellitus without complications: Secondary | ICD-10-CM

## 2017-03-03 DIAGNOSIS — I10 Essential (primary) hypertension: Secondary | ICD-10-CM

## 2017-03-03 DIAGNOSIS — E785 Hyperlipidemia, unspecified: Secondary | ICD-10-CM

## 2017-04-05 ENCOUNTER — Other Ambulatory Visit: Payer: Self-pay | Admitting: Family Medicine

## 2017-04-05 DIAGNOSIS — I10 Essential (primary) hypertension: Secondary | ICD-10-CM

## 2017-04-05 DIAGNOSIS — E119 Type 2 diabetes mellitus without complications: Secondary | ICD-10-CM

## 2017-04-05 DIAGNOSIS — E039 Hypothyroidism, unspecified: Secondary | ICD-10-CM

## 2017-04-05 DIAGNOSIS — E785 Hyperlipidemia, unspecified: Secondary | ICD-10-CM

## 2017-04-28 ENCOUNTER — Ambulatory Visit: Payer: Medicare Other | Admitting: Family Medicine

## 2017-04-28 ENCOUNTER — Encounter: Payer: Self-pay | Admitting: Family Medicine

## 2017-04-28 VITALS — BP 160/83 | HR 83 | Temp 98.0°F | Resp 16 | Ht 68.0 in | Wt 271.2 lb

## 2017-04-28 DIAGNOSIS — Z1211 Encounter for screening for malignant neoplasm of colon: Secondary | ICD-10-CM

## 2017-04-28 DIAGNOSIS — E785 Hyperlipidemia, unspecified: Secondary | ICD-10-CM | POA: Diagnosis not present

## 2017-04-28 DIAGNOSIS — E118 Type 2 diabetes mellitus with unspecified complications: Secondary | ICD-10-CM | POA: Diagnosis not present

## 2017-04-28 DIAGNOSIS — I10 Essential (primary) hypertension: Secondary | ICD-10-CM | POA: Diagnosis not present

## 2017-04-28 DIAGNOSIS — E039 Hypothyroidism, unspecified: Secondary | ICD-10-CM

## 2017-04-28 DIAGNOSIS — E119 Type 2 diabetes mellitus without complications: Secondary | ICD-10-CM

## 2017-04-28 DIAGNOSIS — F411 Generalized anxiety disorder: Secondary | ICD-10-CM | POA: Diagnosis not present

## 2017-04-28 DIAGNOSIS — L602 Onychogryphosis: Secondary | ICD-10-CM | POA: Diagnosis not present

## 2017-04-28 DIAGNOSIS — Z23 Encounter for immunization: Secondary | ICD-10-CM

## 2017-04-28 LAB — POCT GLYCOSYLATED HEMOGLOBIN (HGB A1C): Hemoglobin A1C: 7.5

## 2017-04-28 MED ORDER — LEVOTHYROXINE SODIUM 150 MCG PO TABS: 150.0000 ug | ORAL_TABLET | Freq: Every day | ORAL | 1 refills | Status: DC

## 2017-04-28 MED ORDER — LISINOPRIL 20 MG PO TABS: ORAL_TABLET | ORAL | 1 refills | Status: DC

## 2017-04-28 MED ORDER — METFORMIN HCL 500 MG PO TABS: 500.0000 mg | ORAL_TABLET | Freq: Two times a day (BID) | ORAL | 1 refills | Status: DC

## 2017-04-28 MED ORDER — ATORVASTATIN CALCIUM 10 MG PO TABS: ORAL_TABLET | ORAL | 1 refills | Status: DC

## 2017-04-28 MED ORDER — FLUOXETINE HCL 20 MG PO TABS: 20.0000 mg | ORAL_TABLET | Freq: Every day | ORAL | 1 refills | Status: DC

## 2017-04-28 MED ORDER — HYDROCHLOROTHIAZIDE 25 MG PO TABS: 25.0000 mg | ORAL_TABLET | Freq: Every day | ORAL | 1 refills | Status: DC

## 2017-04-28 NOTE — Progress Notes (Signed)
Subjective:  By signing my name below, I, Essence Howell, attest that this documentation has been prepared under the direction and in the presence of Shade Flood, MD Electronically Signed: Charline Bills, ED Scribe 04/28/2017 at 10:14 AM.   Patient ID: Jackson Sellers, male    DOB: Jan 08, 1946, 72 y.o.   MRN: [id number]  Chief Complaint  Patient presents with  . Diabetes    patient presents for his 6 month DM follow up   HPI Jackson Sellers is a 72 y.o. male who presents to Primary Care at [facility] for f/u.  DM Lab Results  Component Value Date   HGBA1C 7.5 04/28/2017  Previously took metformin 500 mg bid. No known retinopathy or neuropathy. Optho last yr with Dr. Nile Riggs. Foot exam today. Was advised last visit to return for fasting labs within 1 wk which was not done. --- Pt ran out of metformin 2 days ago. He was not able to follow-up for DM visits last yr due to finances.  Hyperlipidemia Lab Results  Component Value Date   CHOL 200 04/10/2015   HDL 40 04/10/2015   LDLCALC 85 04/10/2015   TRIG 376 (H) 04/10/2015   CHOLHDL 5.0 04/10/2015   Lab Results  Component Value Date   ALT 11 04/10/2015   AST 13 04/10/2015   ALKPHOS 52 04/10/2015   BILITOT 0.7 04/10/2015  Lipitor 10 mg qd. Denies myalgias, any new side-effects.  HTN Lab Results  Component Value Date   CREATININE 1.21 04/10/2015  Lisinopril 20 mg qd. HCTZ 25 mg qd. --- Pt ran out of meds 2 days ago. Denies lightheadedness, dizziness, cp, sob.  Hypothyroidism  Lab Results  Component Value Date   TSH 0.515 04/10/2015  Synthroid 150 mcg qd. No recent labs as listed above.  Depression Prozac 20 mg qd.  Health Maintenance Due for colonoscopy but plans to schedule with Dr. Loreta Ave. H/o hemorrhoids several yrs ago. No h/o diverticulitis or family h/o colon CA.  Immunizations  Immunization History  Administered Date(s) Administered  . Influenza Split 02/27/2013  . Influenza, Seasonal, Injecte, Preservative  Fre 02/22/2012  . Influenza,inj,Quad PF,6+ Mos 12/18/2013  . Influenza-Unspecified 11-25-1945, 02/18/2015, 02/11/2017  . Pneumococcal Conjugate-13 12/18/2013  . Pneumococcal Polysaccharide-23 07/01/2009  . Td 05/30/2008  Due for pneumovax.  Patient Active Problem List   Diagnosis Date Noted  . Vertigo 04/12/2012  . Diabetes (HCC) 02/22/2012  . Hypertension 02/22/2012  . GERD (gastroesophageal reflux disease) 02/22/2012  . Hyperlipidemia 02/22/2012  . Hypothyroid 02/22/2012  . Arthritis 02/22/2012   Past Medical History:  Diagnosis Date  . Arthritis   . Cataract   . Diabetes mellitus without complication (HCC)   . GERD (gastroesophageal reflux disease)   . Hypertension   . Thyroid disease    Past Surgical History:  Procedure Laterality Date  . EYE SURGERY    . HERNIA REPAIR     Allergies  Allergen Reactions  . Metoclopramide Hcl Other (See Comments)    Pt states that he" felt like something was crawling all over me"  . Erythrocin Rash  . Penicillins Rash    Has patient had a PCN reaction causing immediate rash, facial/tongue/throat swelling, SOB or lightheadedness with hypotension: No Has patient had a PCN reaction causing severe rash involving mucus membranes or skin necrosis: No Has patient had a PCN reaction that required hospitalization No Has patient had a PCN reaction occurring within the last 10 years:No If all of the above answers are "NO", then may proceed  with Cephalosporin use.    Prior to Admission medications   Medication Sig Start Date End Date Taking? Authorizing Provider  aspirin 81 MG tablet Take 1 tablet (81 mg total) by mouth daily. 05/26/11   Collene Gobbleaub, Steven A, MD  atorvastatin (LIPITOR) 10 MG tablet TAKE 1 TABLET BY MOUTH EVERY DAY NEEDS OFFICE VISIT 04/05/17   Shade FloodGreene, Zyion Doxtater R, MD  cholecalciferol (VITAMIN D) 1000 UNITS tablet Take 1 tablet (1,000 Units total) by mouth 2 (two) times daily. 05/26/11   Collene Gobbleaub, Steven A, MD  FLUoxetine (PROZAC) 20 MG tablet  Take 1 tablet (20 mg total) by mouth daily. 09/11/16   Shade FloodGreene, Nandana Krolikowski R, MD  hydrochlorothiazide (HYDRODIURIL) 25 MG tablet Take 1 tablet (25 mg total) by mouth daily. 09/11/16   Shade FloodGreene, Moni Rothrock R, MD  levothyroxine (SYNTHROID, LEVOTHROID) 150 MCG tablet Take 1 tablet (150 mcg total) by mouth daily before breakfast. 04/05/17   Shade FloodGreene, Mishka Stegemann R, MD  lisinopril (PRINIVIL,ZESTRIL) 20 MG tablet TAKE 1 TABLET BY MOUTH EVERY DAY NEEDS OFFICE VISIT 04/05/17   Shade FloodGreene, Marquell Saenz R, MD  metFORMIN (GLUCOPHAGE) 500 MG tablet Take 1 tablet (500 mg total) by mouth 2 (two) times daily with a meal. TAKE 1 TABLET BY MOUTH TWICE A DAY WITH A MEAL. NEEDS OFFICE VISIT 04/05/17   Shade FloodGreene, Marynell Bies R, MD   Social History   Socioeconomic History  . Marital status: Married    Spouse name: Not on file  . Number of children: Not on file  . Years of education: Not on file  . Highest education level: Not on file  Social Needs  . Financial resource strain: Not on file  . Food insecurity - worry: Not on file  . Food insecurity - inability: Not on file  . Transportation needs - medical: Not on file  . Transportation needs - non-medical: Not on file  Occupational History  . Occupation: Retired  Tobacco Use  . Smoking status: Never Smoker  . Smokeless tobacco: Never Used  Substance and Sexual Activity  . Alcohol use: No  . Drug use: No  . Sexual activity: Yes  Other Topics Concern  . Not on file  Social History Narrative   Married   Education: College   Exercise: Yes   Review of Systems  Constitutional: Negative for fatigue and unexpected weight change.  Eyes: Negative for visual disturbance.  Respiratory: Negative for cough, chest tightness and shortness of breath.   Cardiovascular: Negative for chest pain, palpitations and leg swelling.  Gastrointestinal: Negative for abdominal pain and blood in stool.  Neurological: Negative for dizziness, light-headedness and headaches.      Objective:   Physical Exam    Constitutional: He is oriented to person, place, and time. He appears well-developed and well-nourished.  HENT:  Head: Normocephalic and atraumatic.  Eyes: EOM are normal. Pupils are equal, round, and reactive to light.  Neck: No JVD present. Carotid bruit is not present.  Cardiovascular: Normal rate, regular rhythm and normal heart sounds.  No murmur heard. Pulmonary/Chest: Effort normal and breath sounds normal. He has no rales.  Musculoskeletal: He exhibits no edema.  Neurological: He is alert and oriented to person, place, and time.  Skin: Skin is warm and dry.  Dry feet with some calluses noted. No wounds. Thickened toenails diffusely with some in-turning of great toenails. Nail folds appear to be normal.  Psychiatric: He has a normal mood and affect.  Vitals reviewed.  Vitals:   04/28/17 0937  BP: (!) 160/83  Pulse: 83  Resp: 16  Temp: 98 F (36.7 C)  TempSrc: Oral  SpO2: 96%  Weight: 271 lb 3.2 oz (123 kg)  Height: 5\' 8"  (1.727 m)   Diabetic Foot Exam - Simple   Simple Foot Form Visual Inspection No deformities, no ulcerations, no other skin breakdown bilaterally:  Yes Sensation Testing Intact to touch and monofilament testing bilaterally:  Yes Pulse Check Posterior Tibialis and Dorsalis pulse intact bilaterally:  Yes Comments Toenails are thick and discolored       Assessment & Plan:   Jackson Sellers is a 72 y.o. male Type 2 diabetes mellitus without complication, without long-term current use of insulin (HCC) - Plan: POCT glycosylated hemoglobin (Hb A1C), Microalbumin, urine, Lipid panel, metFORMIN (GLUCOPHAGE) 500 MG tablet, Ambulatory referral to Podiatry, CANCELED: Ambulatory referral to Gastroenterology  - stressed importance of ongoing follow-up and lab work. Handout given last visit on standards of care with diabetes. Prior financial difficulties, but states those are improving now  -  denies any current barriers to care  -check A1c, if borderline  elevated, may continue same regimen as off meds temporarily  - Refer to podiatry for foot care with diabetes  Need for prophylactic vaccination against Streptococcus pneumoniae (pneumococcus) - Plan: Pneumococcal polysaccharide vaccine 23-valent greater than or equal to 2yo subcutaneous/IM - given.   Essential hypertension - Plan: lisinopril (PRINIVIL,ZESTRIL) 20 MG tablet, hydrochlorothiazide (HYDRODIURIL) 25 MG tablet  - Elevated, off meds. Check home readings on restart of same regimen, RTC if elevated  Hypothyroidism, unspecified type - Plan: levothyroxine (SYNTHROID, LEVOTHROID) 150 MCG tablet  - Stable, tolerating current regimen. Medications refilled. Labs pending as above.   Anxiety state - Plan: FLUoxetine (PROZAC) 20 MG tablet  - Continue Prozac same dose, financial difficulties are improving.  Hyperlipidemia, unspecified hyperlipidemia type - Plan: atorvastatin (LIPITOR) 10 MG tablet, Comprehensive metabolic panel  - Previous intolerant Lipitor, restart, depending on levels may repeat after returned on medications for 6 weeks  Screen for colon cancer - Plan: Ambulatory referral to Gastroenterology  Thickened nails - Plan: Ambulatory referral to Podiatry   Meds ordered this encounter  Medications  . metFORMIN (GLUCOPHAGE) 500 MG tablet    Sig: Take 1 tablet (500 mg total) by mouth 2 (two) times daily with a meal.    Dispense:  180 tablet    Refill:  1  . lisinopril (PRINIVIL,ZESTRIL) 20 MG tablet    Sig: TAKE 1 TABLET BY MOUTH EVERY DAY    Dispense:  90 tablet    Refill:  1  . levothyroxine (SYNTHROID, LEVOTHROID) 150 MCG tablet    Sig: Take 1 tablet (150 mcg total) by mouth daily before breakfast.    Dispense:  90 tablet    Refill:  1  . hydrochlorothiazide (HYDRODIURIL) 25 MG tablet    Sig: Take 1 tablet (25 mg total) by mouth daily.    Dispense:  90 tablet    Refill:  1  . FLUoxetine (PROZAC) 20 MG tablet    Sig: Take 1 tablet (20 mg total) by mouth daily.     Dispense:  90 tablet    Refill:  1  . atorvastatin (LIPITOR) 10 MG tablet    Sig: TAKE 1 TABLET BY MOUTH EVERY DAY    Dispense:  90 tablet    Refill:  1   Patient Instructions   Make sure you see me in 3 months for diabetes follow up. If there are any barriers to that follow up, please let me know. No change  in meds for now. Keep a record of your blood pressures outside of the office and if not below 140/90 in next 2 weeks once you have restarted meds - return to discuss changes.   I will refer you to gastroenterology, and podiatry.   Thanks for coming in today.   IF you received an x-ray today, you will receive an invoice from [facility]. Please contact [facility] at [phone] with questions or concerns regarding your invoice.   IF you received labwork today, you will receive an invoice from [place]. Please contact [facility] at [phone] with questions or concerns regarding your invoice.   Our billing staff will not be able to assist you with questions regarding bills from these companies.  You will be contacted with the lab results as soon as they are available. The fastest way to get your results is to activate your My Chart account. Instructions are located on the last page of this paperwork. If you have not heard from [place] regarding the results in 2 weeks, please contact this office.      I personally performed the services described in this documentation, which was scribed in my presence. The recorded information has been reviewed and considered for accuracy and completeness, addended by me as needed, and agree with information above.  Signed,   [name], MD Primary Care at [facility].  04/28/17 10:31 AM

## 2017-04-28 NOTE — Patient Instructions (Addendum)
Make sure you see me in 3 months for diabetes follow up. If there are any barriers to that follow up, please let me know. No change in meds for now. Keep a record of your blood pressures outside of the office and if not below 140/90 in next 2 weeks once you have restarted meds - return to discuss changes.   I will refer you to gastroenterology, and podiatry.  Please make an appointment with your eye doctor  Thanks for coming in today.   IF you received an x-ray today, you will receive an invoice from [facility]. Please contact [facility] at [phone] with questions or concerns regarding your invoice.   IF you received labwork today, you will receive an invoice from [facility]. Please contact [facility] at [phone] with questions or concerns regarding your invoice.   Our billing staff will not be able to assist you with questions regarding bills from these companies.  You will be contacted with the lab results as soon as they are available. The fastest way to get your results is to activate your My Chart account. Instructions are located on the last page of this paperwork. If you have not heard from us regarding the results in 2 weeks, please contact this office.

## 2017-04-29 LAB — COMPREHENSIVE METABOLIC PANEL
ALBUMIN: 4.5 g/dL (ref 3.5–4.8)
ALT: 11 IU/L (ref 0–44)
AST: 14 IU/L (ref 0–40)
Albumin/Globulin Ratio: 2 (ref 1.2–2.2)
Alkaline Phosphatase: 70 IU/L (ref 39–117)
BUN / CREAT RATIO: 18 (ref 10–24)
BUN: 21 mg/dL (ref 8–27)
Bilirubin Total: 0.3 mg/dL (ref 0.0–1.2)
CALCIUM: 9.4 mg/dL (ref 8.6–10.2)
CO2: 22 mmol/L (ref 20–29)
CREATININE: 1.2 mg/dL (ref 0.76–1.27)
Chloride: 99 mmol/L (ref 96–106)
GFR calc Af Amer: 69 mL/min/{1.73_m2} (ref >59)
GFR, EST NON AFRICAN AMERICAN: 60 mL/min/{1.73_m2} (ref >59)
GLOBULIN, TOTAL: 2.2 g/dL (ref 1.5–4.5)
Glucose: 159 mg/dL — ABNORMAL HIGH (ref 65–99)
Potassium: 4.8 mmol/L (ref 3.5–5.2)
SODIUM: 138 mmol/L (ref 134–144)
Total Protein: 6.7 g/dL (ref 6.0–8.5)

## 2017-04-29 LAB — LIPID PANEL
CHOL/HDL RATIO: 4.8 ratio (ref 0.0–5.0)
Cholesterol, Total: 202 mg/dL — ABNORMAL HIGH (ref 100–199)
HDL: 42 mg/dL (ref >39)
LDL Calculated: 95 mg/dL (ref 0–99)
TRIGLYCERIDES: 325 mg/dL — AB (ref 0–149)
VLDL Cholesterol Cal: 65 mg/dL — ABNORMAL HIGH (ref 5–40)

## 2017-04-29 LAB — MICROALBUMIN, URINE: MICROALBUM., U, RANDOM: 3.5 ug/mL

## 2017-05-04 NOTE — Progress Notes (Signed)
Lab letter sent 

## 2017-07-25 ENCOUNTER — Ambulatory Visit: Payer: Medicare Other | Admitting: Family Medicine

## 2017-10-22 ENCOUNTER — Other Ambulatory Visit: Payer: Self-pay | Admitting: Family Medicine

## 2017-10-22 DIAGNOSIS — E785 Hyperlipidemia, unspecified: Secondary | ICD-10-CM

## 2017-10-22 DIAGNOSIS — E039 Hypothyroidism, unspecified: Secondary | ICD-10-CM

## 2017-10-22 DIAGNOSIS — E119 Type 2 diabetes mellitus without complications: Secondary | ICD-10-CM

## 2017-10-22 DIAGNOSIS — I10 Essential (primary) hypertension: Secondary | ICD-10-CM

## 2017-10-24 NOTE — Telephone Encounter (Signed)
Left VM re: refills.  Needs appt. Was to have F/U April 2019

## 2017-10-25 ENCOUNTER — Telehealth: Payer: Self-pay | Admitting: Family Medicine

## 2017-10-25 DIAGNOSIS — F411 Generalized anxiety disorder: Secondary | ICD-10-CM

## 2017-10-25 NOTE — Telephone Encounter (Signed)
Copied from CRM [id number]. Topic: General - Other >> Oct 25, 2017  3:13 PM Marylen PontoMcneil, Ja-Kwan wrote: Reason for CRM: Pt asked for a nurse. Pt requests that a nurse returns his call. Cb# [phone]

## 2017-10-25 NOTE — Telephone Encounter (Signed)
Attempted to call pt.; overdue for office visit; left message to call and schedule appt. with Dr. Neva SeatGreene, before medications can be refilled.

## 2017-10-26 ENCOUNTER — Other Ambulatory Visit: Payer: Self-pay

## 2017-10-26 DIAGNOSIS — I10 Essential (primary) hypertension: Secondary | ICD-10-CM

## 2017-10-26 DIAGNOSIS — E119 Type 2 diabetes mellitus without complications: Secondary | ICD-10-CM

## 2017-10-26 DIAGNOSIS — E039 Hypothyroidism, unspecified: Secondary | ICD-10-CM

## 2017-10-26 MED ORDER — FLUOXETINE HCL 20 MG PO TABS: 20.0000 mg | ORAL_TABLET | Freq: Every day | ORAL | 1 refills | Status: DC

## 2017-10-26 MED ORDER — METFORMIN HCL 500 MG PO TABS: 500.0000 mg | ORAL_TABLET | Freq: Two times a day (BID) | ORAL | 0 refills | Status: DC

## 2017-10-26 MED ORDER — LEVOTHYROXINE SODIUM 150 MCG PO TABS: 150.0000 ug | ORAL_TABLET | Freq: Every day | ORAL | 0 refills | Status: DC

## 2017-10-26 MED ORDER — LISINOPRIL 20 MG PO TABS: ORAL_TABLET | ORAL | 0 refills | Status: DC

## 2017-10-26 NOTE — Telephone Encounter (Signed)
Pt need his medication refilled. Refilled Metformin, Levothyroxine, Lisinopril. Pt is also request his Prozac. He will be going out of town due to death in his family. Will need the remainder of his medication. [facility] [facility] [address]

## 2017-10-26 NOTE — Telephone Encounter (Signed)
Prozac refilled.  Planned 8183-month follow-up when last seen in January.  I am sorry to hear about the death in his family, but please schedule appointment as soon as he can when he returns.

## 2017-10-28 ENCOUNTER — Other Ambulatory Visit: Payer: Self-pay | Admitting: Family Medicine

## 2017-10-28 DIAGNOSIS — E785 Hyperlipidemia, unspecified: Secondary | ICD-10-CM

## 2017-11-01 ENCOUNTER — Ambulatory Visit: Payer: Medicare Other | Admitting: Family Medicine

## 2017-11-07 ENCOUNTER — Telehealth: Payer: Self-pay | Admitting: General Practice

## 2017-11-07 DIAGNOSIS — E119 Type 2 diabetes mellitus without complications: Secondary | ICD-10-CM

## 2017-11-07 NOTE — Telephone Encounter (Signed)
Copied from CRM [id number]. Topic: Quick Communication - Rx Refill/Question >> Nov 07, 2017 12:42 PM Mcneil, Ja-Kwan wrote: Medication: metFORMIN (GLUCOPHAGE) 500 MG tablet  Pt states he takes 2 pills daily therefore the Rx should have been for 60 tablets.  Preferred Pharmacy (with phone number or street name): [id number] [address] [address] [phone] ([phone] (Fax)  Agent: Please be advised that RX refills may take up to 3 business days. We ask that you follow-up with your pharmacy.

## 2017-11-07 NOTE — Telephone Encounter (Signed)
Please correct number given- Sig is for 2 pills daily.  Please correct so patient will have the correct number of pills for the month.

## 2017-11-11 NOTE — Telephone Encounter (Signed)
Rx was correct only get 30 tablets should last 15 days until his next appointment.

## 2017-11-11 NOTE — Telephone Encounter (Signed)
Got it- didn't see were he canceled appointment and reschedule.

## 2017-11-14 ENCOUNTER — Other Ambulatory Visit: Payer: Self-pay | Admitting: Family Medicine

## 2017-11-14 DIAGNOSIS — E119 Type 2 diabetes mellitus without complications: Secondary | ICD-10-CM

## 2017-11-14 MED ORDER — METFORMIN HCL 500 MG PO TABS: 500.0000 mg | ORAL_TABLET | Freq: Two times a day (BID) | ORAL | 0 refills | Status: DC

## 2017-11-14 NOTE — Telephone Encounter (Signed)
Has appt 11/22/17 for rest of refills.

## 2017-11-14 NOTE — Telephone Encounter (Signed)
Copied from CRM [phone]. Topic: Quick Communication - Rx Refill/Question >> Nov 14, 2017  8:32 AM Lenoria ChimeBeasley, Denise S wrote: Medication: metFORMIN (GLUCOPHAGE) 500 MG tablet  Has the patient contacted their pharmacy? Yes.   (Agent: If no, request that the patient contact the pharmacy for the refill.) (Agent: If yes, when and what did the pharmacy advise?)  Preferred Pharmacy (with phone number or street name): [id number] [place], [place] - [address] [phone] ([phone] (Fax)    Agent: Please be advised that RX refills may take up to 3 business days. We ask that you follow-up with your pharmacy.  Pt takes 2 of these pills daily and is completely out. Needs refill until his appt on 11/22/17

## 2017-11-15 NOTE — Telephone Encounter (Signed)
Medication was sent to pharmacy on 11/14/2017.

## 2017-11-15 NOTE — Telephone Encounter (Signed)
Pt has an appt on 11-22-17. Pt needs refill on metformin 500  Mg twice a day. Pt is out of med

## 2017-11-22 ENCOUNTER — Other Ambulatory Visit: Payer: Self-pay | Admitting: Family Medicine

## 2017-11-22 ENCOUNTER — Encounter: Payer: Self-pay | Admitting: Family Medicine

## 2017-11-22 ENCOUNTER — Other Ambulatory Visit: Payer: Self-pay

## 2017-11-22 ENCOUNTER — Ambulatory Visit: Payer: Medicare Other | Admitting: Family Medicine

## 2017-11-22 DIAGNOSIS — I10 Essential (primary) hypertension: Secondary | ICD-10-CM | POA: Diagnosis not present

## 2017-11-22 DIAGNOSIS — F411 Generalized anxiety disorder: Secondary | ICD-10-CM

## 2017-11-22 DIAGNOSIS — E785 Hyperlipidemia, unspecified: Secondary | ICD-10-CM | POA: Diagnosis not present

## 2017-11-22 DIAGNOSIS — E119 Type 2 diabetes mellitus without complications: Secondary | ICD-10-CM

## 2017-11-22 DIAGNOSIS — E039 Hypothyroidism, unspecified: Secondary | ICD-10-CM

## 2017-11-22 MED ORDER — FLUOXETINE HCL 20 MG PO TABS: 20.0000 mg | ORAL_TABLET | Freq: Every day | ORAL | 1 refills | Status: DC

## 2017-11-22 MED ORDER — LISINOPRIL-HYDROCHLOROTHIAZIDE 20-25 MG PO TABS: 1.0000 | ORAL_TABLET | Freq: Every day | ORAL | 1 refills | Status: DC

## 2017-11-22 MED ORDER — METFORMIN HCL 500 MG PO TABS: 500.0000 mg | ORAL_TABLET | Freq: Two times a day (BID) | ORAL | 1 refills | Status: DC

## 2017-11-22 MED ORDER — ATORVASTATIN CALCIUM 10 MG PO TABS: 10.0000 mg | ORAL_TABLET | Freq: Every day | ORAL | 1 refills | Status: DC

## 2017-11-22 MED ORDER — LEVOTHYROXINE SODIUM 150 MCG PO TABS: 150.0000 ug | ORAL_TABLET | Freq: Every day | ORAL | 1 refills | Status: DC

## 2017-11-22 NOTE — Progress Notes (Signed)
Subjective:    Patient ID: Jackson Sellers, male    DOB: 03-Nov-1945, 72 y.o.   MRN: [id number]  HPI Jackson Sellers is a 72 y.o. male Presents today for: Chief Complaint  Patient presents with  . chronic conditions    6 month f/u   . Medication Refill    all meds   Here for follow-up of chronic medical problems.  Hypertension: BP Readings from Last 3 Encounters:  11/22/17 134/73  04/28/17 (!) 160/83  09/11/16 (!) 143/66   Lab Results  Component Value Date   CREATININE 1.20 04/28/2017  Currently taking lisinopril 20 mg daily, HCTZ 25 mg daily, aspirin 81 mg daily.   Hyperlipidemia:  Lab Results  Component Value Date   CHOL 202 (H) 04/28/2017   HDL 42 04/28/2017   LDLCALC 95 04/28/2017   TRIG 325 (H) 04/28/2017   CHOLHDL 4.8 04/28/2017   Lab Results  Component Value Date   ALT 11 04/28/2017   AST 14 04/28/2017   ALKPHOS 70 04/28/2017   BILITOT 0.3 04/28/2017   Currently taking Lipitor 10 mg daily. No new myalgias/side effects. Last ate 3-4 hours ago.   Hypothyroidism: Lab Results  Component Value Date   TSH 0.515 04/10/2015  Takes Synthroid 150 mcg daily. No new temp intolerance, palpitations, hair/skin changes.   Depression/anxiety: He has continued on Prozac 20 mg daily. Was started for stomach issues prior and doing well - would like to remain on same dose.   Diabetes: Lab Results  Component Value Date   HGBA1C 7.5 04/28/2017  Advised 59-month follow-up in January.  First visit back since that time. Did have some financial issues, but doing ok with copay and able to afford meds.  Metformin 500 mg twice daily, aspirin 81 mg daily. Has been out of metformin for 11-12 days - on twice per day, but only 30 pills given as temporary refill, so ran out. Slight HA off meds.  No new side effects when taking meds.  No home blood sugar readings.  Due for ophthalmology exam, last listed 01/07/2016. Foot exam 04/10/2015.  Will be performed today Pneumovax  completed April 28, 2017.  Plans on water aerobics, mowing yard/working in yard for exercise.  Wt Readings from Last 3 Encounters:  11/22/17 266 lb 12.8 oz (121 kg)  04/28/17 271 lb 3.2 oz (123 kg)  09/11/16 269 lb 9.6 oz (122.3 kg)    Health maintenance: Due for colonoscopy, Dr. Loreta Ave - he was unable to schedule due to cost. Will check to see if Cologuard may be option.   Patient Active Problem List   Diagnosis Date Noted  . Vertigo 04/12/2012  . Diabetes (HCC) 02/22/2012  . Hypertension 02/22/2012  . GERD (gastroesophageal reflux disease) 02/22/2012  . Hyperlipidemia 02/22/2012  . Hypothyroid 02/22/2012  . Arthritis 02/22/2012   Past Medical History:  Diagnosis Date  . Arthritis   . Cataract   . Diabetes mellitus without complication (HCC)   . GERD (gastroesophageal reflux disease)   . Hypertension   . Thyroid disease    Past Surgical History:  Procedure Laterality Date  . EYE SURGERY    . HERNIA REPAIR     Allergies  Allergen Reactions  . Metoclopramide Hcl Other (See Comments)    Pt states that he" felt like something was crawling all over me"  . Erythrocin Rash  . Penicillins Rash    Has patient had a PCN reaction causing immediate rash, facial/tongue/throat swelling, SOB or lightheadedness with hypotension: No  Has patient had a PCN reaction causing severe rash involving mucus membranes or skin necrosis: No Has patient had a PCN reaction that required hospitalization No Has patient had a PCN reaction occurring within the last 10 years:No If all of the above answers are "NO", then may proceed with Cephalosporin use.    Prior to Admission medications   Medication Sig Start Date End Date Taking? Authorizing Provider  aspirin 81 MG tablet Take 1 tablet (81 mg total) by mouth daily. 05/26/11  Yes Collene Gobble, MD  atorvastatin (LIPITOR) 10 MG tablet TAKE 1 TABLET BY MOUTH EVERY DAY 10/28/17  Yes Shade Flood, MD  cholecalciferol (VITAMIN D) 1000 UNITS tablet  Take 1 tablet (1,000 Units total) by mouth 2 (two) times daily. 05/26/11  Yes Collene Gobble, MD  FLUoxetine (PROZAC) 20 MG tablet Take 1 tablet (20 mg total) by mouth daily. 10/26/17  Yes Shade Flood, MD  hydrochlorothiazide (HYDRODIURIL) 25 MG tablet Take 1 tablet (25 mg total) by mouth daily. 04/28/17  Yes Shade Flood, MD  levothyroxine (SYNTHROID, LEVOTHROID) 150 MCG tablet Take 1 tablet (150 mcg total) by mouth daily before breakfast. 10/26/17  Yes Shade Flood, MD  lisinopril (PRINIVIL,ZESTRIL) 20 MG tablet TAKE 1 TABLET BY MOUTH EVERY DAY 10/26/17  Yes Shade Flood, MD  metFORMIN (GLUCOPHAGE) 500 MG tablet Take 1 tablet (500 mg total) by mouth 2 (two) times daily with a meal. 11/14/17  Yes Shade Flood, MD   Social History   Socioeconomic History  . Marital status: Married    Spouse name: Not on file  . Number of children: Not on file  . Years of education: Not on file  . Highest education level: Not on file  Occupational History  . Occupation: Retired  Engineer, production  . Financial resource strain: Not on file  . Food insecurity:    Worry: Not on file    Inability: Not on file  . Transportation needs:    Medical: Not on file    Non-medical: Not on file  Tobacco Use  . Smoking status: Never Smoker  . Smokeless tobacco: Never Used  Substance and Sexual Activity  . Alcohol use: No  . Drug use: No  . Sexual activity: Yes  Lifestyle  . Physical activity:    Days per week: Not on file    Minutes per session: Not on file  . Stress: Not on file  Relationships  . Social connections:    Talks on phone: Not on file    Gets together: Not on file    Attends religious service: Not on file    Active member of club or organization: Not on file    Attends meetings of clubs or organizations: Not on file    Relationship status: Not on file  . Intimate partner violence:    Fear of current or ex partner: Not on file    Emotionally abused: Not on file    Physically  abused: Not on file    Forced sexual activity: Not on file  Other Topics Concern  . Not on file  Social History Narrative   Married   Education: College   Exercise: Yes    Review of Systems  Constitutional: Negative for fatigue and unexpected weight change.  Eyes: Negative for visual disturbance.  Respiratory: Negative for cough, chest tightness and shortness of breath.   Cardiovascular: Negative for chest pain, palpitations and leg swelling.  Gastrointestinal: Negative for abdominal pain and  blood in stool.  Endocrine: Negative for cold intolerance and heat intolerance.  Neurological: Negative for dizziness, light-headedness and headaches.      Objective:   Physical Exam  Constitutional: He is oriented to person, place, and time. He appears well-developed and well-nourished.  HENT:  Head: Normocephalic and atraumatic.  Eyes: Pupils are equal, round, and reactive to light. EOM are normal.  Neck: No JVD present. Carotid bruit is not present.  Cardiovascular: Normal rate, regular rhythm and normal heart sounds.  No murmur heard. Pulmonary/Chest: Effort normal and breath sounds normal. He has no rales.  Musculoskeletal: He exhibits no edema.  Neurological: He is alert and oriented to person, place, and time.  Skin: Skin is warm and dry.  Psychiatric: He has a normal mood and affect.  Vitals reviewed.  Vitals:   11/22/17 1407  BP: 134/73  Pulse: 85  Temp: 98.2 F (36.8 C)  TempSrc: Oral  SpO2: 95%  Weight: 266 lb 12.8 oz (121 kg)  Height: 5\' 9"  (1.753 m)      Assessment & Plan:   JADA KUHNERT is a 72 y.o. male Hyperlipidemia, unspecified hyperlipidemia type - Plan: Comprehensive metabolic panel, Lipid panel, atorvastatin (LIPITOR) 10 MG tablet  -  Stable, tolerating current regimen. Medications refilled. Labs pending as above.   Anxiety state - Plan: FLUoxetine (PROZAC) 20 MG tablet  -  Stable, tolerating current regimen. Medications refilled. Option of trial  off med discussed if he would like.    Essential hypertension - Plan: lisinopril-hydrochlorothiazide (PRINZIDE,ZESTORETIC) 20-25 MG tablet  -  Stable, tolerating current regimen. Medications refilled. Labs pending as above.   Hypothyroidism, unspecified type - Plan: TSH, levothyroxine (SYNTHROID, LEVOTHROID) 150 MCG tablet  -  Stable, tolerating current regimen. Medications refilled. Labs pending as above.   Type 2 diabetes mellitus without complication, without long-term current use of insulin (HCC) - Plan: Hemoglobin A1c, metFORMIN (GLUCOPHAGE) 500 MG tablet  - off metformin temporarily, but tolerated prior. Refilled, check labs and wellness exam in 3 months.  HM: Recommended colonoscopy - has been cost prohibitive. Consider  Cologuard - can discuss with GI.  Schedule optho eval.   Meds ordered this encounter  Medications  . atorvastatin (LIPITOR) 10 MG tablet    Sig: Take 1 tablet (10 mg total) by mouth daily.    Dispense:  90 tablet    Refill:  1    DX Code Needed  .  Marland Kitchen FLUoxetine (PROZAC) 20 MG tablet    Sig: Take 1 tablet (20 mg total) by mouth daily.    Dispense:  90 tablet    Refill:  1  . levothyroxine (SYNTHROID, LEVOTHROID) 150 MCG tablet    Sig: Take 1 tablet (150 mcg total) by mouth daily before breakfast.    Dispense:  90 tablet    Refill:  1  . metFORMIN (GLUCOPHAGE) 500 MG tablet    Sig: Take 1 tablet (500 mg total) by mouth 2 (two) times daily with a meal.    Dispense:  180 tablet    Refill:  1  . lisinopril-hydrochlorothiazide (PRINZIDE,ZESTORETIC) 20-25 MG tablet    Sig: Take 1 tablet by mouth daily.    Dispense:  90 tablet    Refill:  1   Patient Instructions   Please schedule eye appointment as soon as possible for diabetic eye screening.  Can discuss colonoscopy timing or whether or not you would be a candidate for Cologuard with your gastroenterologist.  If so, I can order that test if  you would like.   Recheck in 3 months for a physical.   If you  have lab work done today you will be contacted with your lab results within the next 2 weeks.  If you have not heard from us then please contact us. The fastest way to get your results is to register for My Chart.   IF you received an x-ray today, you will receive an invoice from [facility]. Please contact [facility] at [phone] with questions or concerns regarding your invoice.   IF you received labwork today, you will receive an invoice from [facility]. Please contact [facility] at [phone] with questions or concerns regarding your invoice.   Our billing staff will not be able to assist you with questions regarding bills from these companies.  You will be contacted with the lab results as soon as they are available. The fastest way to get your results is to activate your My Chart account. Instructions are located on the last page of this paperwork. If you have not heard from us regarding the results in 2 weeks, please contact this office.      Signed,   Meredith StaggersJeffrey Raahim Shartzer, MD [facility].  11/22/17 2:52 PM

## 2017-11-22 NOTE — Telephone Encounter (Signed)
evothyroxine refill Last Refill:10/26/17 # 30 Last OV: 04/28/17 PCP: Dr Neva SeatGreene  Pharmacy:[facility] [address]  Last TSH 04/10/15  lisinopril refill Last Refill:10/26/17 # 30 Last OV: 04/28/17  Was due for f/u appt made for 3 months, pt canceled appt

## 2017-11-22 NOTE — Patient Instructions (Addendum)
Please schedule eye appointment as soon as possible for diabetic eye screening.  Can discuss colonoscopy timing or whether or not you would be a candidate for Cologuard with your gastroenterologist.  If so, I can order that test if you would like.   Recheck in 3 months for a physical.   If you have lab work done today you will be contacted with your lab results within the next 2 weeks.  If you have not heard from us then please contact us. The fastest way to get your results is to register for My Chart.   IF you received an x-ray today, you will receive an invoice from [facility]. Please contact [facility] at [phone] with questions or concerns regarding your invoice.   IF you received labwork today, you will receive an invoice from [facility]. Please contact [facility] at [phone] with questions or concerns regarding your invoice.   Our billing staff will not be able to assist you with questions regarding bills from these companies.  You will be contacted with the lab results as soon as they are available. The fastest way to get your results is to activate your My Chart account. Instructions are located on the last page of this paperwork. If you have not heard from us regarding the results in 2 weeks, please contact this office.

## 2017-11-23 LAB — COMPREHENSIVE METABOLIC PANEL
ALBUMIN: 4.5 g/dL (ref 3.5–4.8)
ALK PHOS: 61 IU/L (ref 39–117)
ALT: 13 IU/L (ref 0–44)
AST: 15 IU/L (ref 0–40)
Albumin/Globulin Ratio: 1.8 (ref 1.2–2.2)
BILIRUBIN TOTAL: 0.4 mg/dL (ref 0.0–1.2)
BUN/Creatinine Ratio: 13 (ref 10–24)
BUN: 17 mg/dL (ref 8–27)
CO2: 19 mmol/L — AB (ref 20–29)
Calcium: 9.7 mg/dL (ref 8.6–10.2)
Chloride: 101 mmol/L (ref 96–106)
Creatinine, Ser: 1.27 mg/dL (ref 0.76–1.27)
GFR, EST AFRICAN AMERICAN: 65 mL/min/{1.73_m2} (ref >59)
GFR, EST NON AFRICAN AMERICAN: 56 mL/min/{1.73_m2} — AB (ref >59)
GLUCOSE: 128 mg/dL — AB (ref 65–99)
Globulin, Total: 2.5 g/dL (ref 1.5–4.5)
POTASSIUM: 4.4 mmol/L (ref 3.5–5.2)
SODIUM: 139 mmol/L (ref 134–144)
Total Protein: 7 g/dL (ref 6.0–8.5)

## 2017-11-23 LAB — HEMOGLOBIN A1C
Est. average glucose Bld gHb Est-mCnc: 163 mg/dL
HEMOGLOBIN A1C: 7.3 % — AB (ref 4.8–5.6)

## 2017-11-23 LAB — LIPID PANEL
CHOL/HDL RATIO: 4.9 ratio (ref 0.0–5.0)
Cholesterol, Total: 196 mg/dL (ref 100–199)
HDL: 40 mg/dL (ref >39)
LDL Calculated: 92 mg/dL (ref 0–99)
Triglycerides: 319 mg/dL — ABNORMAL HIGH (ref 0–149)
VLDL Cholesterol Cal: 64 mg/dL — ABNORMAL HIGH (ref 5–40)

## 2017-11-23 LAB — TSH: TSH: 0.111 u[IU]/mL — AB (ref 0.450–4.500)

## 2017-12-05 ENCOUNTER — Other Ambulatory Visit: Payer: Self-pay | Admitting: Family Medicine

## 2017-12-05 DIAGNOSIS — E039 Hypothyroidism, unspecified: Secondary | ICD-10-CM

## 2017-12-05 MED ORDER — LEVOTHYROXINE SODIUM 137 MCG PO TABS: 137.0000 ug | ORAL_TABLET | Freq: Every day | ORAL | 1 refills | Status: DC

## 2017-12-12 ENCOUNTER — Telehealth: Payer: Self-pay | Admitting: Family Medicine

## 2017-12-12 NOTE — Telephone Encounter (Signed)
Copied from CRM [phone]. Topic: Quick Communication - Lab Results >> Dec 12, 2017  9:38 AM Leafy Roobinson, Norma J wrote: Pt is calling and would like blood work results from [place]

## 2017-12-13 NOTE — Telephone Encounter (Signed)
I have called the pt and informed him that we have attempted to call him a few times to relay the message about his lab results.   This time he did answer the phone so I did give him the results and he is to get the new synthroid that is at the pharmacy. Pt stated understanding.    [name]  Lisbeth Renshawhan Velasco

## 2018-01-22 ENCOUNTER — Encounter: Payer: Self-pay | Admitting: Family Medicine

## 2018-03-03 ENCOUNTER — Encounter: Payer: Medicare Other | Admitting: Family Medicine

## 2018-03-17 ENCOUNTER — Telehealth: Payer: Self-pay | Admitting: Family Medicine

## 2018-03-17 NOTE — Telephone Encounter (Signed)
Call patient.  Please make sure he received information that his Cologuard was negative or normal.  Can be repeated in 3 years for colon cancer screening.  Let me know if there are questions.

## 2018-03-20 NOTE — Telephone Encounter (Signed)
Left detailed message with results

## 2018-05-05 ENCOUNTER — Encounter: Payer: Medicare Other | Admitting: Family Medicine

## 2018-05-09 ENCOUNTER — Other Ambulatory Visit: Payer: Self-pay | Admitting: Family Medicine

## 2018-05-09 DIAGNOSIS — I10 Essential (primary) hypertension: Secondary | ICD-10-CM

## 2018-05-11 ENCOUNTER — Other Ambulatory Visit: Payer: Self-pay | Admitting: Family Medicine

## 2018-05-11 DIAGNOSIS — F411 Generalized anxiety disorder: Secondary | ICD-10-CM

## 2018-05-11 DIAGNOSIS — E119 Type 2 diabetes mellitus without complications: Secondary | ICD-10-CM

## 2018-05-11 NOTE — Telephone Encounter (Signed)
Requested Prescriptions  Pending Prescriptions Disp Refills  . FLUoxetine (PROZAC) 20 MG tablet [Pharmacy Med Name: FLUOXETINE HCL 20 MG TABLET] 90 tablet 1    Sig: TAKE 1 TABLET BY MOUTH EVERY DAY     Psychiatry:  Antidepressants - SSRI Passed - 05/11/2018  2:35 PM      Passed - Valid encounter within last 6 months    Recent Outpatient Visits          5 months ago Hyperlipidemia, unspecified hyperlipidemia type   [facility], Ranell Patrick, MD   1 year ago Type 2 diabetes mellitus without complication, without long-term current use of insulin [facility])   [facility] at [place] [facility], Ranell Patrick, MD   1 year ago Type 2 diabetes mellitus without complication, without long-term current use of insulin [facility])   [facility] [place], Ranell Patrick, MD   2 years ago Acute pain of left knee   [facility], Ranell Patrick, MD   2 years ago Type 2 diabetes mellitus without complication, without long-term current use of insulin [facility])   [facility] at [place], MD      Future Appointments            Tomorrow Wendie Agreste, MD Primary Care at [place], [facility]         . metFORMIN (GLUCOPHAGE) 500 MG tablet [Pharmacy Med Name: METFORMIN HCL 500 MG TABLET] 180 tablet 1    Sig: TAKE 1 TABLET (500 MG TOTAL) BY MOUTH 2 (TWO) TIMES DAILY WITH A MEAL.     Endocrinology:  Diabetes - Biguanides Passed - 05/11/2018  2:35 PM      Passed - Cr in normal range and within 360 days    Creat  Date Value Ref Range Status  04/10/2015 1.21 0.70 - 1.25 mg/dL Final   Creatinine, Ser  Date Value Ref Range Status  11/22/2017 1.27 0.76 - 1.27 mg/dL Final         Passed - HBA1C is between 0 and 7.9 and within 180 days    Hgb A1c MFr Bld  Date Value Ref Range Status  11/22/2017 7.3 (H) 4.8 - 5.6 % Final    Comment:             Prediabetes: 5.7 - 6.4          Diabetes: >6.4          Glycemic control for adults with diabetes: <7.0          Passed - eGFR in normal  range and within 360 days    GFR, Est African American  Date Value Ref Range Status  04/10/2015 70 >=60 mL/min Final   GFR calc Af Amer  Date Value Ref Range Status  11/22/2017 65 >59 mL/min/1.73 Final   GFR, Est Non African American  Date Value Ref Range Status  04/10/2015 61 >=60 mL/min Final    Comment:      The estimated GFR is a calculation valid for adults (>=72 years old) that uses the CKD-EPI algorithm to adjust for age and sex. It is   not to be used for children, pregnant women, hospitalized patients,    patients on dialysis, or with rapidly changing kidney function. According to the NKDEP, eGFR >89 is normal, 60-89 shows mild impairment, 30-59 shows moderate impairment, 15-29 shows severe impairment and <15 is ESRD.      GFR calc non Af Amer  Date Value Ref Range  Status  11/22/2017 56 (L) >59 mL/min/1.73 Final         Passed - Valid encounter within last 6 months    Recent Outpatient Visits          5 months ago Hyperlipidemia, unspecified hyperlipidemia type   [facility] at [facility], Ranell Patrick, MD   1 year ago Type 2 diabetes mellitus without complication, without long-term current use of insulin [facility])   [facility] at [facility], Ranell Patrick, MD   1 year ago Type 2 diabetes mellitus without complication, without long-term current use of insulin [facility])   [facility] at [place] [facility], Ranell Patrick, MD   2 years ago Acute pain of left knee   [facility] at [facility], Ranell Patrick, MD   2 years ago Type 2 diabetes mellitus without complication, without long-term current use of insulin [facility])   Primary Care at [facility], Loura Back, MD      Future Appointments            Tomorrow Wendie Agreste, MD Primary Care at [place], [facility]

## 2018-05-12 ENCOUNTER — Other Ambulatory Visit: Payer: Self-pay

## 2018-05-12 ENCOUNTER — Ambulatory Visit (INDEPENDENT_AMBULATORY_CARE_PROVIDER_SITE_OTHER): Payer: PPO | Admitting: Family Medicine

## 2018-05-12 ENCOUNTER — Encounter: Payer: Self-pay | Admitting: Family Medicine

## 2018-05-12 VITALS — BP 124/74 | HR 90 | Temp 98.3°F | Ht 69.0 in | Wt 261.2 lb

## 2018-05-12 DIAGNOSIS — E119 Type 2 diabetes mellitus without complications: Secondary | ICD-10-CM | POA: Diagnosis not present

## 2018-05-12 DIAGNOSIS — E785 Hyperlipidemia, unspecified: Secondary | ICD-10-CM | POA: Diagnosis not present

## 2018-05-12 DIAGNOSIS — I10 Essential (primary) hypertension: Secondary | ICD-10-CM

## 2018-05-12 DIAGNOSIS — E039 Hypothyroidism, unspecified: Secondary | ICD-10-CM | POA: Diagnosis not present

## 2018-05-12 DIAGNOSIS — F411 Generalized anxiety disorder: Secondary | ICD-10-CM

## 2018-05-12 DIAGNOSIS — Z0000 Encounter for general adult medical examination without abnormal findings: Secondary | ICD-10-CM

## 2018-05-12 DIAGNOSIS — Z0001 Encounter for general adult medical examination with abnormal findings: Secondary | ICD-10-CM

## 2018-05-12 DIAGNOSIS — Z125 Encounter for screening for malignant neoplasm of prostate: Secondary | ICD-10-CM

## 2018-05-12 MED ORDER — LISINOPRIL-HYDROCHLOROTHIAZIDE 20-25 MG PO TABS: 1.0000 | ORAL_TABLET | Freq: Every day | ORAL | 1 refills | Status: DC

## 2018-05-12 MED ORDER — ATORVASTATIN CALCIUM 10 MG PO TABS: 10.0000 mg | ORAL_TABLET | Freq: Every day | ORAL | 1 refills | Status: DC

## 2018-05-12 MED ORDER — FLUOXETINE HCL 20 MG PO TABS: 20.0000 mg | ORAL_TABLET | Freq: Every day | ORAL | 1 refills | Status: DC

## 2018-05-12 MED ORDER — METFORMIN HCL 500 MG PO TABS: 500.0000 mg | ORAL_TABLET | Freq: Two times a day (BID) | ORAL | 1 refills | Status: DC

## 2018-05-12 MED ORDER — LEVOTHYROXINE SODIUM 137 MCG PO TABS: 137.0000 ug | ORAL_TABLET | Freq: Every day | ORAL | 1 refills | Status: DC

## 2018-05-12 NOTE — Progress Notes (Signed)
Subjective:    Patient ID: Jackson Sellers, male    DOB: [birth year], 73 y.o.   MRN: [id number]  HPI Jackson Sellers is a 73 y.o. male Presents today for: Chief Complaint  Patient presents with  . Annual Exam    CPE   Here for physical and med refills.  Diabetes: Microalbumin: 04/28/17, normal.  Optho, foot exam, pneumovax: Due for optho exam, otherwise up-to-date on diabetes health maintenance. Next appt with optho - none. Plans to schedule in next month with new provider.  Lab Results  Component Value Date   HGBA1C 7.3 (H) 11/22/2017   HGBA1C 7.5 04/28/2017   HGBA1C 6.7 10/03/2015   Lab Results  Component Value Date   MICROALBUR 0.3 04/10/2015   LDLCALC 92 11/22/2017   CREATININE 1.27 11/22/2017  Takes metformin 500 mg twice daily.  Some decreased dosing of meds/out temporarily last visit. Now taking metformin BID. No new side effects.   Hypertension: BP Readings from Last 3 Encounters:  05/12/18 124/74  11/22/17 134/73  04/28/17 (!) 160/83   Lab Results  Component Value Date   CREATININE 1.27 11/22/2017  Lisinopril HCTZ 20/25 mg daily.  Hypothyroidism: Lab Results  Component Value Date   TSH 0.111 (L) 11/22/2017  Synthroid 137 mcg daily.  Medications were adjusted in August, advised to recheck in 6 weeks.  Has not had repeat testing.  No new palpitations, chest pains, no weight/hair/skin changes or temp intolerance.   Depression/anxiety: Tolerating Prozac 47m at previous visit, initially had started for stomach issues, was stable at last visit.  Symptoms stable.   Hyperlipidemia:  Lab Results  Component Value Date   CHOL 196 11/22/2017   HDL 40 11/22/2017   LDLCALC 92 11/22/2017   TRIG 319 (H) 11/22/2017   CHOLHDL 4.9 11/22/2017   Lab Results  Component Value Date   ALT 13 11/22/2017   AST 15 11/22/2017   ALKPHOS 61 11/22/2017   BILITOT 0.4 11/22/2017  Lipitor 10 mg daily. No new myalgias/side effects.   Cancer screening: Negative  Cologuard 02/14/2018. Repeat  Last PSA 0.29 in 2017. - discussed testing. Declines DRE. Would like to check PSA.   Immunization History  Administered Date(s) Administered  . Influenza Split 02/27/2013  . Influenza, High Dose Seasonal PF 02/11/2017, 01/22/2018  . Influenza, Seasonal, Injecte, Preservative Fre 02/22/2012  . Influenza,inj,Quad PF,6+ Mos 12/18/2013  . Influenza-Unspecified 0September 07, 1947 02/18/2015, 02/11/2017  . Pneumococcal Conjugate-13 12/18/2013  . Pneumococcal Polysaccharide-23 07/01/2009, 04/28/2017  . Td 05/30/2008   Fall screen: No falls in past year.   [facility] 05/12/2018 11/22/2017 04/28/2017 09/11/2016 04/09/2016  Decreased Interest 0 0 0 0 0  Down, Depressed, Hopeless 0 0 0 0 0  PHQ - 2 Score 0 0 0 0 0    Functional Status Survey: Is the patient deaf or have difficulty hearing?: Yes(cant hear some tones) Does the patient have difficulty seeing, even when wearing glasses/contacts?: No Does the patient have difficulty concentrating, remembering, or making decisions?: No Does the patient have difficulty walking or climbing stairs?: Yes(yes for the steps to the knees) Does the patient have difficulty dressing or bathing?: No Does the patient have difficulty doing errands alone such as visiting a doctor's office or shopping?: No Plans to call prior ENT for hearing screening.  Has had some issues with dizziness if ears flushed. Would like to defer cleaning.   Memory screen: 6CIT Screen 05/12/2018  What Year? 0 points  What month? 0 points  What time? 0  points  Count back from 20 0 points  Months in reverse 0 points  Repeat phrase 0 points  Total Score 0    Visual Acuity Screening   Right eye Left eye Both eyes  Without correction:     With correction: 20/25 20/20-1 20/20-1  as above - plans to schedule.   Dental: saw last year.   Exercise: walking once per week, water aerobics.  Body mass index is 38.57 kg/m. Wt Readings from Last 3  Encounters:  05/12/18 261 lb 3.2 oz (118.5 kg)  11/22/17 266 lb 12.8 oz (121 kg)  04/28/17 271 lb 3.2 oz (123 kg)   Advanced directives: Has living will.    Patient Active Problem List   Diagnosis Date Noted  . Vertigo 04/12/2012  . Diabetes ([place]) 02/22/2012  . Hypertension 02/22/2012  . GERD (gastroesophageal reflux disease) 02/22/2012  . Hyperlipidemia 02/22/2012  . Hypothyroid 02/22/2012  . Arthritis 02/22/2012   Past Medical History:  Diagnosis Date  . Arthritis   . Cataract   . Diabetes mellitus without complication ([place])   . GERD (gastroesophageal reflux disease)   . Hypertension   . Thyroid disease    Past Surgical History:  Procedure Laterality Date  . EYE SURGERY    . HERNIA REPAIR     Allergies  Allergen Reactions  . Metoclopramide Hcl Other (See Comments)    Pt states that he" felt like something was crawling all over me"  . Erythrocin Rash  . Penicillins Rash    Has patient had a PCN reaction causing immediate rash, facial/tongue/throat swelling, SOB or lightheadedness with hypotension: No Has patient had a PCN reaction causing severe rash involving mucus membranes or skin necrosis: No Has patient had a PCN reaction that required hospitalization No Has patient had a PCN reaction occurring within the last 10 years:No If all of the above answers are "NO", then may proceed with Cephalosporin use.    Prior to Admission medications   Medication Sig Start Date End Date Taking? Authorizing Provider  aspirin 81 MG tablet Take 1 tablet (81 mg total) by mouth daily. 05/26/11  Yes Darlyne Russian, MD  atorvastatin (LIPITOR) 10 MG tablet Take 1 tablet (10 mg total) by mouth daily. 11/22/17  Yes Wendie Agreste, MD  cholecalciferol (VITAMIN D) 1000 UNITS tablet Take 1 tablet (1,000 Units total) by mouth 2 (two) times daily. 05/26/11  Yes Darlyne Russian, MD  FLUoxetine (PROZAC) 20 MG tablet TAKE 1 TABLET BY MOUTH EVERY DAY 05/11/18  Yes Wendie Agreste, MD    levothyroxine (SYNTHROID, LEVOTHROID) 137 MCG tablet Take 1 tablet (137 mcg total) by mouth daily before breakfast. 12/05/17  Yes Wendie Agreste, MD  lisinopril-hydrochlorothiazide (PRINZIDE,ZESTORETIC) 20-25 MG tablet TAKE 1 TABLET BY MOUTH EVERY DAY 05/09/18  Yes Wendie Agreste, MD  metFORMIN (GLUCOPHAGE) 500 MG tablet TAKE 1 TABLET (500 MG TOTAL) BY MOUTH 2 (TWO) TIMES DAILY WITH A MEAL. 05/11/18  Yes Wendie Agreste, MD   Social History   Socioeconomic History  . Marital status: Married    Spouse name: Not on file  . Number of children: Not on file  . Years of education: Not on file  . Highest education level: Not on file  Occupational History  . Occupation: Retired  Scientific laboratory technician  . Financial resource strain: Not on file  . Food insecurity:    Worry: Not on file    Inability: Not on file  . Transportation needs:    Medical:  Not on file    Non-medical: Not on file  Tobacco Use  . Smoking status: Never Smoker  . Smokeless tobacco: Never Used  Substance and Sexual Activity  . Alcohol use: No  . Drug use: No  . Sexual activity: Yes  Lifestyle  . Physical activity:    Days per week: Not on file    Minutes per session: Not on file  . Stress: Not on file  Relationships  . Social connections:    Talks on phone: Not on file    Gets together: Not on file    Attends religious service: Not on file    Active member of club or organization: Not on file    Attends meetings of clubs or organizations: Not on file    Relationship status: Not on file  . Intimate partner violence:    Fear of current or ex partner: Not on file    Emotionally abused: Not on file    Physically abused: Not on file    Forced sexual activity: Not on file  Other Topics Concern  . Not on file  Social History Narrative   Married   Education: College   Exercise: Yes    Review of Systems  HENT: Positive for tinnitus.   Eyes: Positive for itching.  Musculoskeletal: Positive for arthralgias.  all  above issues chronic, no recent changes or worsening.  13 point review of systems per patient health survey noted.  Negative other than as indicated above or in HPI.      Objective:   Physical Exam Vitals signs reviewed.  Constitutional:      Appearance: He is well-developed.  HENT:     Head: Normocephalic and atraumatic.     Right Ear: External ear normal. There is impacted cerumen.     Left Ear: Tympanic membrane, ear canal and external ear normal.  Eyes:     Conjunctiva/sclera: Conjunctivae normal.     Pupils: Pupils are equal, round, and reactive to light.  Neck:     Musculoskeletal: Normal range of motion and neck supple.     Thyroid: No thyromegaly.  Cardiovascular:     Rate and Rhythm: Normal rate and regular rhythm.     Heart sounds: Normal heart sounds.  Pulmonary:     Effort: Pulmonary effort is normal. No respiratory distress.     Breath sounds: Normal breath sounds. No wheezing.  Abdominal:     General: There is no distension.     Palpations: Abdomen is soft.     Tenderness: There is no abdominal tenderness.  Musculoskeletal: Normal range of motion.        General: No tenderness.  Lymphadenopathy:     Cervical: No cervical adenopathy.  Skin:    General: Skin is warm and dry.  Neurological:     Mental Status: He is alert and oriented to person, place, and time.     Deep Tendon Reflexes: Reflexes are normal and symmetric.  Psychiatric:        Behavior: Behavior normal.    Vitals:   05/12/18 0929  BP: 124/74  Pulse: 90  Temp: 98.3 F (36.8 C)  TempSrc: Oral  SpO2: 95%  Weight: 261 lb 3.2 oz (118.5 kg)  Height: 5' 9" (1.753 m)       Assessment & Plan:   LENTON GENDREAU is a 73 y.o. male Medicare annual wellness visit, subsequent  - - anticipatory guidance as below in AVS, screening labs if needed. Health maintenance items as  above in HPI discussed/recommended as applicable.  - no concerning responses on depression, fall, or functional status  screening. Any positive responses noted as above. Advanced directives discussed as in [facility].   -Did have cerumen impaction right, but reports vertiginous symptoms in the past with attempted treatments.  With decreased hearing recommend he follow-up with ENT for both hearing checked as well as evaluation for cerumen removal.  -Recommended ophthalmology exam given history of diabetes.   - exercise discussed   Hyperlipidemia, unspecified hyperlipidemia type - Plan: Lipid panel, Comprehensive metabolic panel, atorvastatin (LIPITOR) 10 MG tablet  -Tolerating Lipitor current dose, check labs.  Type 2 diabetes mellitus without complication, without long-term current use of insulin (HCC) - Plan: Hemoglobin A1c, Microalbumin / creatinine urine ratio, metFORMIN (GLUCOPHAGE) 500 MG tablet  -Improved med adherence.  Check A1c, creatinine.  Continue metformin same dose for now.  Hypothyroidism, unspecified type - Plan: TSH, levothyroxine (SYNTHROID, LEVOTHROID) 137 MCG tablet  -Check TSH, continue Synthroid same dose for now.  Screening for prostate cancer - Plan: PSA  - We discussed pros and cons of prostate cancer screening, and after this discussion, he chose to have screening done. PSA obtained, declined DRE.   Anxiety state - Plan: FLUoxetine (PROZAC) 20 MG tablet  -Overall stable with Prozac.  Continue same dose  Essential hypertension - Plan: lisinopril-hydrochlorothiazide (PRINZIDE,ZESTORETIC) 20-25 MG tablet  -  Stable, tolerating current regimen. Medications refilled. Labs pending as above.    No orders of the defined types were placed in this encounter.  Patient Instructions   Please schedule ophthalmology or eye care provider visit for checking for changes in the eyes with diabetes.  That should be performed every year.   Also with the decreased hearing I do recommend hearing screening.  You can call your previous ear nose and throat specialist or let me know if a referral is needed. Also  follow up with ENT to have R ear wax removed.   Low intensity exercise such as walking or water aerobics most days per week.    Earwax Buildup, Adult The ears produce a substance called earwax that helps keep bacteria out of the ear and protects the skin in the ear canal. Occasionally, earwax can build up in the ear and cause discomfort or hearing loss. What increases the risk? This condition is more likely to develop in people who:  Are male.  Are elderly.  Naturally produce more earwax.  Clean their ears often with cotton swabs.  Use earplugs often.  Use in-ear headphones often.  Wear hearing aids.  Have narrow ear canals.  Have earwax that is overly thick or sticky.  Have eczema.  Are dehydrated.  Have excess hair in the ear canal. What are the signs or symptoms? Symptoms of this condition include:  Reduced or muffled hearing.  A feeling of fullness in the ear or feeling that the ear is plugged.  Fluid coming from the ear.  Ear pain.  Ear itch.  Ringing in the ear.  Coughing.  An obvious piece of earwax that can be seen inside the ear canal. How is this diagnosed? This condition may be diagnosed based on:  Your symptoms.  Your medical history.  An ear exam. During the exam, your health care provider will look into your ear with an instrument called an otoscope. You may have tests, including a hearing test. How is this treated? This condition may be treated by:  Using ear drops to soften the earwax.  Having the earwax  removed by a health care provider. The health care provider may: ? Flush the ear with water. ? Use an instrument that has a loop on the end (curette). ? Use a suction device.  Surgery to remove the wax buildup. This may be done in severe cases. Follow these instructions at home:   Take over-the-counter and prescription medicines only as told by your health care provider.  Do not put any objects, including cotton swabs, into  your ear. You can clean the opening of your ear canal with a washcloth or facial tissue.  Follow instructions from your health care provider about cleaning your ears. Do not over-clean your ears.  Drink enough fluid to keep your urine clear or pale yellow. This will help to thin the earwax.  Keep all follow-up visits as told by your health care provider. If earwax builds up in your ears often or if you use hearing aids, consider seeing your health care provider for routine, preventive ear cleanings. Ask your health care provider how often you should schedule your cleanings.  If you have hearing aids, clean them according to instructions from the manufacturer and your health care provider. Contact a health care provider if:  You have ear pain.  You develop a fever.  You have blood, pus, or other fluid coming from your ear.  You have hearing loss.  You have ringing in your ears that does not go away.  Your symptoms do not improve with treatment.  You feel like the room is spinning (vertigo). Summary  Earwax can build up in the ear and cause discomfort or hearing loss.  The most common symptoms of this condition include reduced or muffled hearing and a feeling of fullness in the ear or feeling that the ear is plugged.  This condition may be diagnosed based on your symptoms, your medical history, and an ear exam.  This condition may be treated by using ear drops to soften the earwax or by having the earwax removed by a health care provider.  Do not put any objects, including cotton swabs, into your ear. You can clean the opening of your ear canal with a washcloth or facial tissue. This information is not intended to replace advice given to you by your health care provider. Make sure you discuss any questions you have with your health care provider. Document Released: 04/22/2004 Document Revised: 02/24/2017 Document Reviewed: 05/26/2016 Elsevier Interactive Patient Education  2019  [place] 65 Years and Older, Male Preventive care refers to lifestyle choices and visits with your health care provider that can promote health and wellness. What does preventive care include?   A yearly physical exam. This is also called an annual well check.  Dental exams once or twice a year.  Routine eye exams. Ask your health care provider how often you should have your eyes checked.  Personal lifestyle choices, including: ? Daily care of your teeth and gums. ? Regular physical activity. ? Eating a healthy diet. ? Avoiding tobacco and drug use. ? Limiting alcohol use. ? Practicing safe sex. ? Taking low doses of aspirin every day. ? Taking vitamin and mineral supplements as recommended by your health care provider. What happens during an annual well check? The services and screenings done by your health care provider during your annual well check will depend on your age, overall health, lifestyle risk factors, and family history of disease. Counseling Your health care provider may ask you questions about your:  Alcohol  use.  Tobacco use.  Drug use.  Emotional well-being.  Home and relationship well-being.  Sexual activity.  Eating habits.  History of falls.  Memory and ability to understand (cognition).  Work and work Statistician. Screening You may have the following tests or measurements:  Height, weight, and BMI.  Blood pressure.  Lipid and cholesterol levels. These may be checked every 5 years, or more frequently if you are over 76 years old.  Skin check.  Lung cancer screening. You may have this screening every year starting at age 25 if you have a 30-pack-year history of smoking and currently smoke or have quit within the past 15 years.  Colorectal cancer screening. All adults should have this screening starting at age 47 and continuing until age 48. You will have tests every 1-10 years, depending on your results and the  type of screening test. People at increased risk should start screening at an earlier age. Screening tests may include: ? Guaiac-based fecal occult blood testing. ? Fecal immunochemical test (FIT). ? Stool DNA test. ? Virtual colonoscopy. ? Sigmoidoscopy. During this test, a flexible tube with a tiny camera (sigmoidoscope) is used to examine your rectum and lower colon. The sigmoidoscope is inserted through your anus into your rectum and lower colon. ? Colonoscopy. During this test, a long, thin, flexible tube with a tiny camera (colonoscope) is used to examine your entire colon and rectum.  Prostate cancer screening. Recommendations will vary depending on your family history and other risks.  Hepatitis C blood test.  Hepatitis B blood test.  Sexually transmitted disease (STD) testing.  Diabetes screening. This is done by checking your blood sugar (glucose) after you have not eaten for a while (fasting). You may have this done every 1-3 years.  Abdominal aortic aneurysm (AAA) screening. You may need this if you are a current or former smoker.  Osteoporosis. You may be screened starting at age 87 if you are at high risk. Talk with your health care provider about your test results, treatment options, and if necessary, the need for more tests. Vaccines Your health care provider may recommend certain vaccines, such as:  Influenza vaccine. This is recommended every year.  Tetanus, diphtheria, and acellular pertussis (Tdap, Td) vaccine. You may need a Td booster every 10 years.  Varicella vaccine. You may need this if you have not been vaccinated.  Zoster vaccine. You may need this after age 68.  Measles, mumps, and rubella (MMR) vaccine. You may need at least one dose of MMR if you were born in 1957 or later. You may also need a second dose.  Pneumococcal 13-valent conjugate (PCV13) vaccine. One dose is recommended after age 50.  Pneumococcal polysaccharide (PPSV23) vaccine. One dose  is recommended after age 49.  Meningococcal vaccine. You may need this if you have certain conditions.  Hepatitis A vaccine. You may need this if you have certain conditions or if you travel or work in places where you may be exposed to hepatitis A.  Hepatitis B vaccine. You may need this if you have certain conditions or if you travel or work in places where you may be exposed to hepatitis B.  Haemophilus influenzae type b (Hib) vaccine. You may need this if you have certain risk factors. Talk to your health care provider about which screenings and vaccines you need and how often you need them. This information is not intended to replace advice given to you by your health care provider. Make sure you discuss any  questions you have with your health care provider. Document Released: 04/11/2015 Document Revised: 05/05/2017 Document Reviewed: 01/14/2015 Elsevier Interactive Patient Education  [employer].   If you have lab work done today you will be contacted with your lab results within the next 2 weeks.  If you have not heard from [place] then please contact us. The fastest way to get your results is to register for My Chart.   IF you received an x-ray today, you will receive an invoice from [facility]. Please contact [facility] at [phone] with questions or concerns regarding your invoice.   IF you received labwork today, you will receive an invoice from [place]. Please contact [facility] at [phone] with questions or concerns regarding your invoice.   Our billing staff will not be able to assist you with questions regarding bills from these companies.  You will be contacted with the lab results as soon as they are available. The fastest way to get your results is to activate your My Chart account. Instructions are located on the last page of this paperwork. If you have not heard from [place] regarding the results in 2 weeks, please contact this office.        Signed,   Merri Ray, MD [facility] [place].  05/12/18 10:31 AM

## 2018-05-12 NOTE — Patient Instructions (Addendum)
Please schedule ophthalmology or eye care provider visit for checking for changes in the eyes with diabetes.  That should be performed every year.   Also with the decreased hearing I do recommend hearing screening.  You can call your previous ear nose and throat specialist or let me know if a referral is needed. Also follow up with ENT to have R ear wax removed.   Low intensity exercise such as walking or water aerobics most days per week.    Earwax Buildup, Adult The ears produce a substance called earwax that helps keep bacteria out of the ear and protects the skin in the ear canal. Occasionally, earwax can build up in the ear and cause discomfort or hearing loss. What increases the risk? This condition is more likely to develop in people who:  Are male.  Are elderly.  Naturally produce more earwax.  Clean their ears often with cotton swabs.  Use earplugs often.  Use in-ear headphones often.  Wear hearing aids.  Have narrow ear canals.  Have earwax that is overly thick or sticky.  Have eczema.  Are dehydrated.  Have excess hair in the ear canal. What are the signs or symptoms? Symptoms of this condition include:  Reduced or muffled hearing.  A feeling of fullness in the ear or feeling that the ear is plugged.  Fluid coming from the ear.  Ear pain.  Ear itch.  Ringing in the ear.  Coughing.  An obvious piece of earwax that can be seen inside the ear canal. How is this diagnosed? This condition may be diagnosed based on:  Your symptoms.  Your medical history.  An ear exam. During the exam, your health care provider will look into your ear with an instrument called an otoscope. You may have tests, including a hearing test. How is this treated? This condition may be treated by:  Using ear drops to soften the earwax.  Having the earwax removed by a health care provider. The health care provider may: ? Flush the ear with water. ? Use an instrument that  has a loop on the end (curette). ? Use a suction device.  Surgery to remove the wax buildup. This may be done in severe cases. Follow these instructions at home:   Take over-the-counter and prescription medicines only as told by your health care provider.  Do not put any objects, including cotton swabs, into your ear. You can clean the opening of your ear canal with a washcloth or facial tissue.  Follow instructions from your health care provider about cleaning your ears. Do not over-clean your ears.  Drink enough fluid to keep your urine clear or pale yellow. This will help to thin the earwax.  Keep all follow-up visits as told by your health care provider. If earwax builds up in your ears often or if you use hearing aids, consider seeing your health care provider for routine, preventive ear cleanings. Ask your health care provider how often you should schedule your cleanings.  If you have hearing aids, clean them according to instructions from the manufacturer and your health care provider. Contact a health care provider if:  You have ear pain.  You develop a fever.  You have blood, pus, or other fluid coming from your ear.  You have hearing loss.  You have ringing in your ears that does not go away.  Your symptoms do not improve with treatment.  You feel like the room is spinning (vertigo). Summary  Earwax can build  up in the ear and cause discomfort or hearing loss.  The most common symptoms of this condition include reduced or muffled hearing and a feeling of fullness in the ear or feeling that the ear is plugged.  This condition may be diagnosed based on your symptoms, your medical history, and an ear exam.  This condition may be treated by using ear drops to soften the earwax or by having the earwax removed by a health care provider.  Do not put any objects, including cotton swabs, into your ear. You can clean the opening of your ear canal with a washcloth or facial  tissue. This information is not intended to replace advice given to you by your health care provider. Make sure you discuss any questions you have with your health care provider. Document Released: 04/22/2004 Document Revised: 02/24/2017 Document Reviewed: 05/26/2016 Elsevier Interactive Patient Education  2019 [place] 65 Years and Older, Male Preventive care refers to lifestyle choices and visits with your health care provider that can promote health and wellness. What does preventive care include?   A yearly physical exam. This is also called an annual well check.  Dental exams once or twice a year.  Routine eye exams. Ask your health care provider how often you should have your eyes checked.  Personal lifestyle choices, including: ? Daily care of your teeth and gums. ? Regular physical activity. ? Eating a healthy diet. ? Avoiding tobacco and drug use. ? Limiting alcohol use. ? Practicing safe sex. ? Taking low doses of aspirin every day. ? Taking vitamin and mineral supplements as recommended by your health care provider. What happens during an annual well check? The services and screenings done by your health care provider during your annual well check will depend on your age, overall health, lifestyle risk factors, and family history of disease. Counseling Your health care provider may ask you questions about your:  Alcohol use.  Tobacco use.  Drug use.  Emotional well-being.  Home and relationship well-being.  Sexual activity.  Eating habits.  History of falls.  Memory and ability to understand (cognition).  Work and work Statistician. Screening You may have the following tests or measurements:  Height, weight, and BMI.  Blood pressure.  Lipid and cholesterol levels. These may be checked every 5 years, or more frequently if you are over 28 years old.  Skin check.  Lung cancer screening. You may have this screening every year  starting at age 81 if you have a 30-pack-year history of smoking and currently smoke or have quit within the past 15 years.  Colorectal cancer screening. All adults should have this screening starting at age 46 and continuing until age 40. You will have tests every 1-10 years, depending on your results and the type of screening test. People at increased risk should start screening at an earlier age. Screening tests may include: ? Guaiac-based fecal occult blood testing. ? Fecal immunochemical test (FIT). ? Stool DNA test. ? Virtual colonoscopy. ? Sigmoidoscopy. During this test, a flexible tube with a tiny camera (sigmoidoscope) is used to examine your rectum and lower colon. The sigmoidoscope is inserted through your anus into your rectum and lower colon. ? Colonoscopy. During this test, a long, thin, flexible tube with a tiny camera (colonoscope) is used to examine your entire colon and rectum.  Prostate cancer screening. Recommendations will vary depending on your family history and other risks.  Hepatitis C blood test.  Hepatitis B blood test.  Sexually transmitted disease (STD) testing.  Diabetes screening. This is done by checking your blood sugar (glucose) after you have not eaten for a while (fasting). You may have this done every 1-3 years.  Abdominal aortic aneurysm (AAA) screening. You may need this if you are a current or former smoker.  Osteoporosis. You may be screened starting at age 68 if you are at high risk. Talk with your health care provider about your test results, treatment options, and if necessary, the need for more tests. Vaccines Your health care provider may recommend certain vaccines, such as:  Influenza vaccine. This is recommended every year.  Tetanus, diphtheria, and acellular pertussis (Tdap, Td) vaccine. You may need a Td booster every 10 years.  Varicella vaccine. You may need this if you have not been vaccinated.  Zoster vaccine. You may need this  after age 89.  Measles, mumps, and rubella (MMR) vaccine. You may need at least one dose of MMR if you were born in 1957 or later. You may also need a second dose.  Pneumococcal 13-valent conjugate (PCV13) vaccine. One dose is recommended after age 39.  Pneumococcal polysaccharide (PPSV23) vaccine. One dose is recommended after age 65.  Meningococcal vaccine. You may need this if you have certain conditions.  Hepatitis A vaccine. You may need this if you have certain conditions or if you travel or work in places where you may be exposed to hepatitis A.  Hepatitis B vaccine. You may need this if you have certain conditions or if you travel or work in places where you may be exposed to hepatitis B.  Haemophilus influenzae type b (Hib) vaccine. You may need this if you have certain risk factors. Talk to your health care provider about which screenings and vaccines you need and how often you need them. This information is not intended to replace advice given to you by your health care provider. Make sure you discuss any questions you have with your health care provider. Document Released: 04/11/2015 Document Revised: 05/05/2017 Document Reviewed: 01/14/2015 Elsevier Interactive Patient Education  [employer].   If you have lab work done today you will be contacted with your lab results within the next 2 weeks.  If you have not heard from [place] then please contact us. The fastest way to get your results is to register for My Chart.   IF you received an x-ray today, you will receive an invoice from [facility]. Please contact [facility] at [phone] with questions or concerns regarding your invoice.   IF you received labwork today, you will receive an invoice from [place]. Please contact [facility] at [phone] with questions or concerns regarding your invoice.   Our billing staff will not be able to assist you with questions regarding bills from these  companies.  You will be contacted with the lab results as soon as they are available. The fastest way to get your results is to activate your My Chart account. Instructions are located on the last page of this paperwork. If you have not heard from [place] regarding the results in 2 weeks, please contact this office.

## 2018-05-13 ENCOUNTER — Encounter: Payer: Self-pay | Admitting: Family Medicine

## 2018-05-13 LAB — COMPREHENSIVE METABOLIC PANEL
A/G RATIO: 2.2 (ref 1.2–2.2)
ALBUMIN: 4.7 g/dL (ref 3.7–4.7)
ALT: 9 IU/L (ref 0–44)
AST: 18 IU/L (ref 0–40)
Alkaline Phosphatase: 60 IU/L (ref 39–117)
BUN / CREAT RATIO: 13 (ref 10–24)
BUN: 17 mg/dL (ref 8–27)
Bilirubin Total: 0.5 mg/dL (ref 0.0–1.2)
CALCIUM: 9.8 mg/dL (ref 8.6–10.2)
CO2: 21 mmol/L (ref 20–29)
Chloride: 99 mmol/L (ref 96–106)
Creatinine, Ser: 1.33 mg/dL — ABNORMAL HIGH (ref 0.76–1.27)
GFR calc Af Amer: 61 mL/min/{1.73_m2} (ref >59)
GFR, EST NON AFRICAN AMERICAN: 53 mL/min/{1.73_m2} — AB (ref >59)
GLOBULIN, TOTAL: 2.1 g/dL (ref 1.5–4.5)
Glucose: 134 mg/dL — ABNORMAL HIGH (ref 65–99)
POTASSIUM: 4.8 mmol/L (ref 3.5–5.2)
SODIUM: 138 mmol/L (ref 134–144)
Total Protein: 6.8 g/dL (ref 6.0–8.5)

## 2018-05-13 LAB — LIPID PANEL
CHOLESTEROL TOTAL: 200 mg/dL — AB (ref 100–199)
Chol/HDL Ratio: 5.1 ratio — ABNORMAL HIGH (ref 0.0–5.0)
HDL: 39 mg/dL — AB (ref >39)
LDL CALC: 85 mg/dL (ref 0–99)
Triglycerides: 378 mg/dL — ABNORMAL HIGH (ref 0–149)
VLDL CHOLESTEROL CAL: 76 mg/dL — AB (ref 5–40)

## 2018-05-13 LAB — MICROALBUMIN / CREATININE URINE RATIO
CREATININE, UR: 73.8 mg/dL
Microalb/Creat Ratio: <4 mg/g creat (ref 0–29)

## 2018-05-13 LAB — PSA: Prostate Specific Ag, Serum: 0.4 ng/mL (ref 0.0–4.0)

## 2018-05-13 LAB — HEMOGLOBIN A1C
Est. average glucose Bld gHb Est-mCnc: 154 mg/dL
HEMOGLOBIN A1C: 7 % — AB (ref 4.8–5.6)

## 2018-05-13 LAB — TSH: TSH: 0.546 u[IU]/mL (ref 0.450–4.500)

## 2018-05-18 ENCOUNTER — Telehealth: Payer: Self-pay | Admitting: Family Medicine

## 2018-05-18 NOTE — Telephone Encounter (Signed)
Copied from CRM [id number]. Topic: Quick Communication - Rx Refill/Question >> May 18, 2018  2:50 PM [place], [place] D wrote: Medication: FLUoxetine (PROZAC) 20 MG tablet / Pt stated he used to receive the fluoxetine HCL capsules and that cost him $10 for a 90 day and the tablets cost pt $184. He would like to know if Dr. Neva Seat will change back to capsules. Please advise.   Has the patient contacted their pharmacy? Yes.   (Agent: If no, request that the patient contact the pharmacy for the refill.) (Agent: If yes, when and what did the pharmacy advise?)  Preferred Pharmacy (with phone number or street name): [id number] [place], [place] - [address] AT [address] [facility] [address] [phone] (Phone) [phone] (Fax)  Agent: Please be advised that RX refills may take up to 3 business days. We ask that you follow-up with your pharmacy.

## 2018-05-18 NOTE — Telephone Encounter (Signed)
FLUoxetine (PROZAC) 20 MG tablet  Pt stated he used to receive the fluoxetine HCL capsules and that cost him $10 for a 90 day; tablets cost  $184. He would like to know if Dr. Neva Seat will change back to capsules. Please advise.      [id number] [place], [place] - [address]       [phone] (Phone) [phone] (Fax)

## 2018-05-22 ENCOUNTER — Other Ambulatory Visit: Payer: Self-pay

## 2018-05-22 DIAGNOSIS — F411 Generalized anxiety disorder: Secondary | ICD-10-CM

## 2018-05-22 MED ORDER — FLUOXETINE HCL 20 MG PO CAPS: 20.0000 mg | ORAL_CAPSULE | Freq: Every day | ORAL | 1 refills | Status: DC

## 2018-05-22 NOTE — Telephone Encounter (Signed)
Rx has been resent to pharmacy  

## 2018-05-24 ENCOUNTER — Encounter: Payer: Self-pay | Admitting: Radiology

## 2018-06-23 ENCOUNTER — Ambulatory Visit: Payer: PPO | Admitting: Family Medicine

## 2018-08-22 ENCOUNTER — Other Ambulatory Visit: Payer: Self-pay | Admitting: *Deleted

## 2018-08-30 ENCOUNTER — Other Ambulatory Visit: Payer: Self-pay | Admitting: *Deleted

## 2018-08-30 NOTE — Patient Outreach (Signed)
[facility]) Care Management  08/30/2018  Jackson Sellers April 05, 1945 [id number]   RN Health Coach attempted follow up outreach call to patient.  Patient was unavailable. HIPPA compliance voicemail message left with return callback number.  Plan: RN will call patient again within 30 days.  Gean Maidens BSN RN [facility] Care Management [phone]

## 2018-09-14 ENCOUNTER — Other Ambulatory Visit: Payer: Self-pay | Admitting: *Deleted

## 2018-09-14 NOTE — Patient Outreach (Signed)
[facility]) Care Management  09/14/2018  BURLIE CAJAMARCA [birth year] [id number]   RN Health Coach is closing this program. Consumer is enrolled in [place] [facility] external program.  [place] Care Management [phone]

## 2018-09-22 ENCOUNTER — Telehealth: Payer: Self-pay | Admitting: Family Medicine

## 2018-09-22 NOTE — Telephone Encounter (Signed)
Pt states the pharmacy advised him the  lisinopril-hydrochlorothiazide (PRINZIDE,ZESTORETIC) 20-25 MG tablet Is on back order.  Requesting this be prescribed in 2 separate meds  Lisinopril 20 mg Hydrochlorothiazide 25 mg  He states the pharmacy deeps telling him they have tried to contact the dr. But no response  [id number] [place], [place] - [address] [phone] (Phone) [phone] (Fax)

## 2018-09-25 ENCOUNTER — Other Ambulatory Visit: Payer: Self-pay

## 2018-09-25 ENCOUNTER — Other Ambulatory Visit: Payer: Self-pay | Admitting: Family Medicine

## 2018-09-25 DIAGNOSIS — I10 Essential (primary) hypertension: Secondary | ICD-10-CM

## 2018-09-25 MED ORDER — LISINOPRIL 20 MG PO TABS: 20.0000 mg | ORAL_TABLET | Freq: Every day | ORAL | 0 refills | Status: DC

## 2018-09-25 MED ORDER — HYDROCHLOROTHIAZIDE 25 MG PO TABS: 25.0000 mg | ORAL_TABLET | Freq: Every day | ORAL | 0 refills | Status: DC

## 2018-09-25 NOTE — Telephone Encounter (Signed)
Rx sent to pharmacy   

## 2018-10-17 ENCOUNTER — Other Ambulatory Visit: Payer: Self-pay | Admitting: Family Medicine

## 2018-10-17 DIAGNOSIS — I10 Essential (primary) hypertension: Secondary | ICD-10-CM

## 2018-10-21 ENCOUNTER — Other Ambulatory Visit: Payer: Self-pay | Admitting: Family Medicine

## 2018-10-21 DIAGNOSIS — I10 Essential (primary) hypertension: Secondary | ICD-10-CM

## 2018-10-21 NOTE — Telephone Encounter (Signed)
Requested Prescriptions  Pending Prescriptions Disp Refills  . lisinopril (ZESTRIL) 20 MG tablet [Pharmacy Med Name: LISINOPRIL 20 MG TABLET] 90 tablet 0    Sig: TAKE 1 TABLET BY MOUTH EVERY DAY     Cardiovascular:  ACE Inhibitors Failed - 10/21/2018  9:24 AM      Failed - Cr in normal range and within 180 days    Creat  Date Value Ref Range Status  04/10/2015 1.21 0.70 - 1.25 mg/dL Final   Creatinine, Ser  Date Value Ref Range Status  05/12/2018 1.33 (H) 0.76 - 1.27 mg/dL Final         Passed - K in normal range and within 180 days    Potassium  Date Value Ref Range Status  05/12/2018 4.8 3.5 - 5.2 mmol/L Final         Passed - Patient is not pregnant      Passed - Last BP in normal range    BP Readings from Last 1 Encounters:  05/12/18 124/74         Passed - Valid encounter within last 6 months    Recent Outpatient Visits          5 months ago Medicare annual wellness visit, subsequent   [facility], Ranell Patrick, MD   11 months ago Hyperlipidemia, unspecified hyperlipidemia type   [facility], Ranell Patrick, MD   1 year ago Type 2 diabetes mellitus without complication, without long-term current use of insulin [facility])   [facility], Ranell Patrick, MD   2 years ago Type 2 diabetes mellitus without complication, without long-term current use of insulin [facility])   [facility], Ranell Patrick, MD   2 years ago Acute pain of left knee   [facility], Ranell Patrick, MD

## 2018-11-06 ENCOUNTER — Other Ambulatory Visit: Payer: Self-pay | Admitting: Family Medicine

## 2018-11-06 DIAGNOSIS — E785 Hyperlipidemia, unspecified: Secondary | ICD-10-CM

## 2018-11-06 DIAGNOSIS — F411 Generalized anxiety disorder: Secondary | ICD-10-CM

## 2018-11-06 NOTE — Telephone Encounter (Signed)
Requested Prescriptions  Pending Prescriptions Disp Refills  . FLUoxetine (PROZAC) 20 MG capsule [Pharmacy Med Name: FLUOXETINE HCL 20 MG CAPSULE] 30 capsule 0    Sig: TAKE 1 CAPSULE BY MOUTH EVERY DAY     Psychiatry:  Antidepressants - SSRI Failed - 11/06/2018 12:26 AM      Failed - Completed PHQ-2 or PHQ-9 in the last 360 days.      Passed - Valid encounter within last 6 months    Recent Outpatient Visits          5 months ago Medicare annual wellness visit, subsequent   [facility], Ranell Patrick, MD   11 months ago Hyperlipidemia, unspecified hyperlipidemia type   [facility], Ranell Patrick, MD   1 year ago Type 2 diabetes mellitus without complication, without long-term current use of insulin [facility])   [facility], Ranell Patrick, MD   2 years ago Type 2 diabetes mellitus without complication, without long-term current use of insulin [facility])   [facility], Ranell Patrick, MD   2 years ago Acute pain of left knee   [facility], Ranell Patrick, MD             . atorvastatin (LIPITOR) 10 MG tablet [Pharmacy Med Name: ATORVASTATIN 10 MG TABLET] 30 tablet 0    Sig: TAKE 1 TABLET BY MOUTH EVERY DAY     Cardiovascular:  Antilipid - Statins Failed - 11/06/2018 12:26 AM      Failed - Total Cholesterol in normal range and within 360 days    Cholesterol, Total  Date Value Ref Range Status  05/12/2018 200 (H) 100 - 199 mg/dL Final         Failed - HDL in normal range and within 360 days    HDL  Date Value Ref Range Status  05/12/2018 39 (L) >39 mg/dL Final         Failed - Triglycerides in normal range and within 360 days    Triglycerides  Date Value Ref Range Status  05/12/2018 378 (H) 0 - 149 mg/dL Final         Passed - LDL in normal range and within 360 days    LDL Calculated  Date Value Ref Range Status  05/12/2018 85 0 - 99 mg/dL Final         Passed - Patient is not pregnant      Passed - Valid  encounter within last 12 months    Recent Outpatient Visits          5 months ago Medicare annual wellness visit, subsequent   [facility], Ranell Patrick, MD   11 months ago Hyperlipidemia, unspecified hyperlipidemia type   [facility], Ranell Patrick, MD   1 year ago Type 2 diabetes mellitus without complication, without long-term current use of insulin [facility])   [facility], Ranell Patrick, MD   2 years ago Type 2 diabetes mellitus without complication, without long-term current use of insulin [facility])   [facility], Ranell Patrick, MD   2 years ago Acute pain of left knee   [facility], Ranell Patrick, MD

## 2018-11-30 ENCOUNTER — Other Ambulatory Visit: Payer: Self-pay | Admitting: Family Medicine

## 2018-11-30 DIAGNOSIS — E785 Hyperlipidemia, unspecified: Secondary | ICD-10-CM

## 2018-11-30 DIAGNOSIS — F411 Generalized anxiety disorder: Secondary | ICD-10-CM

## 2018-11-30 NOTE — Telephone Encounter (Signed)
Requested medication (s) are due for refill today: yes  Requested medication (s) are on the active medication list: yes  Last refill:  11/06/2018  Future visit scheduled: no  Notes to clinic: review for refill  Requested Prescriptions  Pending Prescriptions Disp Refills   atorvastatin (LIPITOR) 10 MG tablet [Pharmacy Med Name: ATORVASTATIN 10 MG TABLET] 30 tablet 0    Sig: TAKE 1 TABLET BY MOUTH EVERY DAY     Cardiovascular:  Antilipid - Statins Failed - 11/30/2018 10:32 AM      Failed - Total Cholesterol in normal range and within 360 days    Cholesterol, Total  Date Value Ref Range Status  05/12/2018 200 (H) 100 - 199 mg/dL Final         Failed - HDL in normal range and within 360 days    HDL  Date Value Ref Range Status  05/12/2018 39 (L) >39 mg/dL Final         Failed - Triglycerides in normal range and within 360 days    Triglycerides  Date Value Ref Range Status  05/12/2018 378 (H) 0 - 149 mg/dL Final         Passed - LDL in normal range and within 360 days    LDL Calculated  Date Value Ref Range Status  05/12/2018 85 0 - 99 mg/dL Final         Passed - Patient is not pregnant      Passed - Valid encounter within last 12 months    Recent Outpatient Visits          6 months ago Medicare annual wellness visit, subsequent   [facility], Ranell Patrick, MD   1 year ago Hyperlipidemia, unspecified hyperlipidemia type   [facility], Ranell Patrick, MD   1 year ago Type 2 diabetes mellitus without complication, without long-term current use of insulin [facility])   [facility], Ranell Patrick, MD   2 years ago Type 2 diabetes mellitus without complication, without long-term current use of insulin [facility])   [facility], Ranell Patrick, MD   2 years ago Acute pain of left knee   [facility], Ranell Patrick, MD

## 2018-12-03 ENCOUNTER — Other Ambulatory Visit: Payer: Self-pay | Admitting: Family Medicine

## 2018-12-03 DIAGNOSIS — E039 Hypothyroidism, unspecified: Secondary | ICD-10-CM

## 2018-12-05 NOTE — Telephone Encounter (Signed)
Requested medication (s) are due for refill today: yes  Requested medication (s) are on the active medication list: yes  Last refill: 09/09/2018  Future visit scheduled: no  Notes to clinic:  Review for refill   Requested Prescriptions  Pending Prescriptions Disp Refills   levothyroxine (SYNTHROID) 137 MCG tablet [Pharmacy Med Name: LEVOTHYROXINE 137 MCG TABLET] 90 tablet 1    Sig: TAKE 1 TABLET BY MOUTH [place]     Endocrinology:  Hypothyroid Agents Failed - 12/03/2018 11:44 PM      Failed - TSH needs to be rechecked within 3 months after an abnormal result. Refill until TSH is due.      Passed - TSH in normal range and within 360 days    TSH  Date Value Ref Range Status  05/12/2018 0.546 0.450 - 4.500 uIU/mL Final         Passed - Valid encounter within last 12 months    Recent Outpatient Visits          6 months ago Medicare annual wellness visit, subsequent   [facility], Ranell Patrick, MD   1 year ago Hyperlipidemia, unspecified hyperlipidemia type   [facility], Ranell Patrick, MD   1 year ago Type 2 diabetes mellitus without complication, without long-term current use of insulin [facility])   [facility], Ranell Patrick, MD   2 years ago Type 2 diabetes mellitus without complication, without long-term current use of insulin [facility])   [facility], Ranell Patrick, MD   2 years ago Acute pain of left knee   [facility], Ranell Patrick, MD

## 2018-12-09 ENCOUNTER — Other Ambulatory Visit: Payer: Self-pay | Admitting: Family Medicine

## 2018-12-09 DIAGNOSIS — F411 Generalized anxiety disorder: Secondary | ICD-10-CM

## 2018-12-22 ENCOUNTER — Other Ambulatory Visit: Payer: Self-pay

## 2018-12-22 ENCOUNTER — Ambulatory Visit (INDEPENDENT_AMBULATORY_CARE_PROVIDER_SITE_OTHER): Payer: PPO | Admitting: Family Medicine

## 2018-12-22 ENCOUNTER — Encounter: Payer: Self-pay | Admitting: Family Medicine

## 2018-12-22 VITALS — BP 112/69 | HR 86 | Temp 97.4°F | Wt 269.0 lb

## 2018-12-22 DIAGNOSIS — E039 Hypothyroidism, unspecified: Secondary | ICD-10-CM | POA: Diagnosis not present

## 2018-12-22 DIAGNOSIS — F411 Generalized anxiety disorder: Secondary | ICD-10-CM

## 2018-12-22 DIAGNOSIS — E785 Hyperlipidemia, unspecified: Secondary | ICD-10-CM | POA: Diagnosis not present

## 2018-12-22 DIAGNOSIS — H6121 Impacted cerumen, right ear: Secondary | ICD-10-CM | POA: Diagnosis not present

## 2018-12-22 DIAGNOSIS — I10 Essential (primary) hypertension: Secondary | ICD-10-CM | POA: Diagnosis not present

## 2018-12-22 DIAGNOSIS — E119 Type 2 diabetes mellitus without complications: Secondary | ICD-10-CM | POA: Diagnosis not present

## 2018-12-22 MED ORDER — LISINOPRIL-HYDROCHLOROTHIAZIDE 20-25 MG PO TABS: 1.0000 | ORAL_TABLET | Freq: Every day | ORAL | 1 refills | Status: DC

## 2018-12-22 MED ORDER — METFORMIN HCL 500 MG PO TABS: 500.0000 mg | ORAL_TABLET | Freq: Two times a day (BID) | ORAL | 1 refills | Status: DC

## 2018-12-22 MED ORDER — ATORVASTATIN CALCIUM 10 MG PO TABS: 10.0000 mg | ORAL_TABLET | Freq: Every day | ORAL | 1 refills | Status: DC

## 2018-12-22 MED ORDER — LEVOTHYROXINE SODIUM 137 MCG PO TABS: 137.0000 ug | ORAL_TABLET | Freq: Every day | ORAL | 1 refills | Status: DC

## 2018-12-22 MED ORDER — FLUOXETINE HCL 20 MG PO CAPS: 20.0000 mg | ORAL_CAPSULE | Freq: Every day | ORAL | 1 refills | Status: DC

## 2018-12-22 NOTE — Patient Instructions (Addendum)
  No change in medications for now.  Let me know if you would like to meet with a cardiologist for decreased hearing.  Recheck in 6 months if A1c is controlled.  If you have lab work done today you will be contacted with your lab results within the next 2 weeks.  If you have not heard from [place] then please contact us. The fastest way to get your results is to register for My Chart.   IF you received an x-ray today, you will receive an invoice from [facility]. Please contact [facility] at [phone] with questions or concerns regarding your invoice.   IF you received labwork today, you will receive an invoice from [place]. Please contact [facility] at [phone] with questions or concerns regarding your invoice.   Our billing staff will not be able to assist you with questions regarding bills from these companies.  You will be contacted with the lab results as soon as they are available. The fastest way to get your results is to activate your My Chart account. Instructions are located on the last page of this paperwork. If you have not heard from [place] regarding the results in 2 weeks, please contact this office.

## 2018-12-22 NOTE — Progress Notes (Signed)
Subjective:    Patient ID: Jackson Sellers, male    DOB: 1945-05-29, 73 y.o.   MRN: [id number]  HPI ELLIAS MCELREATH is a 73 y.o. male Presents today for: Chief Complaint  Patient presents with  . Diabetes    Here todoy fr medication review.  last office visit in February.  Here for med review.   Diabetes: Associated with chronic kidney disease.  Last creatinine 1.33 in February, 1.27 in 10/2017 - eGFR 56 at that time.  Avoidance of NSAIDs and maintenance of hydration discussed.not taking nsaids. Staying hydrated.  He is on ACE inhibitor as well as statin. Previously treated with metformin 547m twice daily.no new side effects/GI intolerance.  Microalbumin: Normal ratio in February No home readings.  Optho, foot exam, pneumovax: Due for eye exam - on waiting list to get into their office.  foot exam today  Some decreased exercise with gyms closing - less water aerobics.  Knees limiting exercise at times.   Diabetic Foot Exam - Simple   No data filed    Pneumovax 2019.  Lab Results  Component Value Date   HGBA1C 7.0 (H) 05/12/2018   HGBA1C 7.3 (H) 11/22/2017   HGBA1C 7.5 04/28/2017   Lab Results  Component Value Date   MICROALBUR 0.3 04/10/2015   LDLCALC 85 05/12/2018   CREATININE 1.33 (H) 05/12/2018    Hyperlipidemia:  Lab Results  Component Value Date   CHOL 200 (H) 05/12/2018   HDL 39 (L) 05/12/2018   LDLCALC 85 05/12/2018   TRIG 378 (H) 05/12/2018   CHOLHDL 5.1 (H) 05/12/2018   Lab Results  Component Value Date   ALT 9 05/12/2018   AST 18 05/12/2018   ALKPHOS 60 05/12/2018   BILITOT 0.5 05/12/2018  Lipitor 10 mg daily. Taking daily, no new side effects, only rare myalgia after strenuous activity only.    Hypertension: BP Readings from Last 3 Encounters:  12/22/18 112/69  05/12/18 124/74  11/22/17 134/73   Lab Results  Component Value Date   CREATININE 1.33 (H) 05/12/2018  Lisinopril HCTZ 20/25 mg daily. No home readings.  Aspirin 81 mg  daily. Risks of bleeding discussed.  No new side effects.  Hypothyroidism: Lab Results  Component Value Date   TSH 0.546 05/12/2018  Synthroid 137 mcg daily. Taking medication daily.  No new hot or cold intolerance. No new hair or skin changes, heart palpitations or new fatigue. No new weight changes.   Anxiety:  [facility] 12/22/2018 05/12/2018 11/22/2017 04/28/2017 09/11/2016  Decreased Interest 0 0 0 0 0  Down, Depressed, Hopeless 0 0 0 0 0  PHQ - 2 Score 0 0 0 0 0  Prozac 20 mg daily.  Doing well on current dose, even with stressors of pandemic. Has been a good health year. No colds, infections. Feeling great.   Hearing has been getting worse. Slow decline. Would like to delay audiology eval until next year.   Review of Systems  Constitutional: Negative for fatigue and unexpected weight change.  Eyes: Negative for visual disturbance.  Respiratory: Negative for cough, chest tightness and shortness of breath.   Cardiovascular: Negative for chest pain, palpitations and leg swelling.  Gastrointestinal: Negative for abdominal pain and blood in stool.  Neurological: Negative for dizziness, light-headedness and headaches.   As above.      Objective:   Physical Exam Vitals signs reviewed.  Constitutional:      Appearance: He is well-developed.  HENT:     Head: Normocephalic  and atraumatic.     Right Ear: Ear canal and external ear normal. There is impacted cerumen.     Left Ear: Tympanic membrane, ear canal and external ear normal. There is no impacted cerumen.  Eyes:     Pupils: Pupils are equal, round, and reactive to light.  Neck:     Vascular: No carotid bruit or JVD.  Cardiovascular:     Rate and Rhythm: Normal rate and regular rhythm.     Heart sounds: Normal heart sounds. No murmur.  Pulmonary:     Effort: Pulmonary effort is normal.     Breath sounds: Normal breath sounds. No rales.  Skin:    General: Skin is warm and dry.  Neurological:     Mental  Status: He is alert and oriented to person, place, and time.    Vitals:   12/22/18 1458  BP: 112/69  Pulse: 86  Temp: (!) 97.4 F (36.3 C)  TempSrc: Oral  SpO2: 97%  Weight: 269 lb (122 kg)       Assessment & Plan:   DRAKE WUERTZ is a 73 y.o. male Impacted cerumen of right ear - Plan: Ear wax removal  -Resolved after lavage.  Slight erythema of canal but no perforation noted.  RTC precautions discussed if pain/acute change in hearing.  Likely has some chronic hearing loss as well.  Offered audiology referral, would like to defer until next year.(Patient instructions should state audiologist, not cardiologist).   Hyperlipidemia, unspecified hyperlipidemia type - Plan: Comprehensive metabolic panel, Lipid Panel, atorvastatin (LIPITOR) 10 MG tablet  -Check labs, continue Lipitor  Anxiety state - Plan: FLUoxetine (PROZAC) 20 MG capsule  -Stable on current dose of Prozac, continue same.  Hypothyroidism, unspecified type - Plan: levothyroxine (SYNTHROID) 137 MCG tablet  -Continue Synthroid same dose.   Essential hypertension - Plan: lisinopril-hydrochlorothiazide (ZESTORETIC) 20-25 MG tablet  -Stable, continue same dose Zestoretic.  Type 2 diabetes mellitus without complication, without long-term current use of insulin (HCC) - Plan: Hemoglobin A1c, metFORMIN (GLUCOPHAGE) 500 MG tablet, HM Diabetes Foot Exam  -Check A1c, no changes for now and metformin dosing.  Increase exercise as able/tolerated with orthopedic issues.  Meds ordered this encounter  Medications  . atorvastatin (LIPITOR) 10 MG tablet    Sig: Take 1 tablet (10 mg total) by mouth daily.    Dispense:  90 tablet    Refill:  1  . FLUoxetine (PROZAC) 20 MG capsule    Sig: Take 1 capsule (20 mg total) by mouth daily.    Dispense:  90 capsule    Refill:  1  . levothyroxine (SYNTHROID) 137 MCG tablet    Sig: Take 1 tablet (137 mcg total) by mouth daily before breakfast.    Dispense:  90 tablet    Refill:  1  .  lisinopril-hydrochlorothiazide (ZESTORETIC) 20-25 MG tablet    Sig: Take 1 tablet by mouth daily.    Dispense:  90 tablet    Refill:  1  . metFORMIN (GLUCOPHAGE) 500 MG tablet    Sig: Take 1 tablet (500 mg total) by mouth 2 (two) times daily with a meal.    Dispense:  180 tablet    Refill:  1   Patient Instructions    No change in medications for now.  Let me know if you would like to meet with a cardiologist for decreased hearing.  Recheck in 6 months if A1c is controlled.  If you have lab work done today you will be contacted with  your lab results within the next 2 weeks.  If you have not heard from [place] then please contact us. The fastest way to get your results is to register for My Chart.   IF you received an x-ray today, you will receive an invoice from [facility]. Please contact [facility] at [phone] with questions or concerns regarding your invoice.   IF you received labwork today, you will receive an invoice from [facility]. Please contact [facility] at [phone] with questions or concerns regarding your invoice.   Our billing staff will not be able to assist you with questions regarding bills from these companies.  You will be contacted with the lab results as soon as they are available. The fastest way to get your results is to activate your My Chart account. Instructions are located on the last page of this paperwork. If you have not heard from [place] regarding the results in 2 weeks, please contact this office.       Signed,   Merri Ray, MD [facility].  12/23/18 10:51 AM

## 2018-12-23 ENCOUNTER — Encounter: Payer: Self-pay | Admitting: Family Medicine

## 2018-12-23 LAB — COMPREHENSIVE METABOLIC PANEL
ALT: 12 IU/L (ref 0–44)
AST: 16 IU/L (ref 0–40)
Albumin/Globulin Ratio: 1.7 (ref 1.2–2.2)
Albumin: 4.2 g/dL (ref 3.7–4.7)
Alkaline Phosphatase: 62 IU/L (ref 39–117)
BUN/Creatinine Ratio: 13 (ref 10–24)
BUN: 18 mg/dL (ref 8–27)
Bilirubin Total: 0.2 mg/dL (ref 0.0–1.2)
CO2: 20 mmol/L (ref 20–29)
Calcium: 9.2 mg/dL (ref 8.6–10.2)
Chloride: 100 mmol/L (ref 96–106)
Creatinine, Ser: 1.35 mg/dL — ABNORMAL HIGH (ref 0.76–1.27)
GFR calc Af Amer: 60 mL/min/{1.73_m2} (ref >59)
GFR calc non Af Amer: 52 mL/min/{1.73_m2} — ABNORMAL LOW (ref >59)
Globulin, Total: 2.5 g/dL (ref 1.5–4.5)
Glucose: 134 mg/dL — ABNORMAL HIGH (ref 65–99)
Potassium: 4.5 mmol/L (ref 3.5–5.2)
Sodium: 136 mmol/L (ref 134–144)
Total Protein: 6.7 g/dL (ref 6.0–8.5)

## 2018-12-23 LAB — LIPID PANEL
Chol/HDL Ratio: 5.9 ratio — ABNORMAL HIGH (ref 0.0–5.0)
Cholesterol, Total: 195 mg/dL (ref 100–199)
HDL: 33 mg/dL — ABNORMAL LOW (ref >39)
LDL Chol Calc (NIH): 66 mg/dL (ref 0–99)
Triglycerides: 628 mg/dL (ref 0–149)
VLDL Cholesterol Cal: 96 mg/dL — ABNORMAL HIGH (ref 5–40)

## 2018-12-23 LAB — HEMOGLOBIN A1C
Est. average glucose Bld gHb Est-mCnc: 166 mg/dL
Hgb A1c MFr Bld: 7.4 % — ABNORMAL HIGH (ref 4.8–5.6)

## 2019-01-01 ENCOUNTER — Other Ambulatory Visit: Payer: Self-pay | Admitting: Family Medicine

## 2019-01-01 DIAGNOSIS — E785 Hyperlipidemia, unspecified: Secondary | ICD-10-CM

## 2019-01-01 DIAGNOSIS — E781 Pure hyperglyceridemia: Secondary | ICD-10-CM

## 2019-03-01 DIAGNOSIS — Z961 Presence of intraocular lens: Secondary | ICD-10-CM | POA: Diagnosis not present

## 2019-03-01 DIAGNOSIS — H524 Presbyopia: Secondary | ICD-10-CM | POA: Diagnosis not present

## 2019-03-01 DIAGNOSIS — H5213 Myopia, bilateral: Secondary | ICD-10-CM | POA: Diagnosis not present

## 2019-03-01 DIAGNOSIS — H52203 Unspecified astigmatism, bilateral: Secondary | ICD-10-CM | POA: Diagnosis not present

## 2019-03-01 DIAGNOSIS — E119 Type 2 diabetes mellitus without complications: Secondary | ICD-10-CM | POA: Diagnosis not present

## 2019-03-01 LAB — HM DIABETES EYE EXAM

## 2019-03-21 ENCOUNTER — Encounter: Payer: Self-pay | Admitting: Family Medicine

## 2019-05-13 ENCOUNTER — Ambulatory Visit: Payer: PPO | Attending: Internal Medicine

## 2019-05-13 DIAGNOSIS — Z23 Encounter for immunization: Secondary | ICD-10-CM | POA: Insufficient documentation

## 2019-05-13 NOTE — Progress Notes (Signed)
   Covid-19 Vaccination Clinic  Name:  Jackson Sellers    MRN: [id number] DOB: Jun 01, 1945  05/13/2019  [name] was observed post Covid-19 immunization for 15 minutes without incidence. He was provided with Vaccine Information Sheet and instruction to access the V-Safe system.   [name] was instructed to call 911 with any severe reactions post vaccine: Marland Kitchen Difficulty breathing  . Swelling of your face and throat  . A fast heartbeat  . A bad rash all over your body  . Dizziness and weakness    Immunizations Administered    Name Date Dose VIS Date Route   Pfizer COVID-19 Vaccine 05/13/2019 11:39 AM 0.3 mL 03/09/2019 Intramuscular   Manufacturer: [employer], [employer]   Lot: [id number]   NDC: [address]-2

## 2019-05-22 ENCOUNTER — Telehealth: Payer: Self-pay | Admitting: Family Medicine

## 2019-05-22 NOTE — Telephone Encounter (Signed)
Paper work located and put in provider box to be completed

## 2019-05-22 NOTE — Telephone Encounter (Signed)
Error

## 2019-05-22 NOTE — Telephone Encounter (Signed)
Jackson Sellers called to see if we had received fax from  [employer] to be completed by provider for this patient

## 2019-06-05 ENCOUNTER — Ambulatory Visit: Payer: HMO | Attending: Internal Medicine

## 2019-06-05 DIAGNOSIS — Z23 Encounter for immunization: Secondary | ICD-10-CM

## 2019-06-05 NOTE — Progress Notes (Signed)
   Covid-19 Vaccination Clinic  Name:  CAMIL WILHELMSEN    MRN: [id number] DOB: 10-09-1945  06/05/2019  Mr. Macmurray was observed post Covid-19 immunization for 15 minutes without incident. He was provided with Vaccine Information Sheet and instruction to access the V-Safe system.   Mr. Cundari was instructed to call 911 with any severe reactions post vaccine: Marland Kitchen Difficulty breathing  . Swelling of face and throat  . A fast heartbeat  . A bad rash all over body  . Dizziness and weakness   Immunizations Administered    Name Date Dose VIS Date Route   Pfizer COVID-19 Vaccine 06/05/2019  2:33 PM 0.3 mL 03/09/2019 Intramuscular   Manufacturer: [employer], [employer]   Lot: [id number]   NDC: [address]-7

## 2019-06-15 ENCOUNTER — Other Ambulatory Visit: Payer: Self-pay | Admitting: Family Medicine

## 2019-06-15 ENCOUNTER — Ambulatory Visit (INDEPENDENT_AMBULATORY_CARE_PROVIDER_SITE_OTHER): Payer: HMO

## 2019-06-15 ENCOUNTER — Encounter: Payer: Self-pay | Admitting: Family Medicine

## 2019-06-15 ENCOUNTER — Other Ambulatory Visit: Payer: Self-pay

## 2019-06-15 ENCOUNTER — Ambulatory Visit (INDEPENDENT_AMBULATORY_CARE_PROVIDER_SITE_OTHER): Payer: HMO | Admitting: Family Medicine

## 2019-06-15 VITALS — BP 134/80 | HR 88 | Temp 97.2°F | Ht 69.0 in | Wt 261.0 lb

## 2019-06-15 DIAGNOSIS — F411 Generalized anxiety disorder: Secondary | ICD-10-CM | POA: Diagnosis not present

## 2019-06-15 DIAGNOSIS — N1831 Chronic kidney disease, stage 3a: Secondary | ICD-10-CM

## 2019-06-15 DIAGNOSIS — M25561 Pain in right knee: Secondary | ICD-10-CM

## 2019-06-15 DIAGNOSIS — E1122 Type 2 diabetes mellitus with diabetic chronic kidney disease: Secondary | ICD-10-CM

## 2019-06-15 DIAGNOSIS — I10 Essential (primary) hypertension: Secondary | ICD-10-CM

## 2019-06-15 DIAGNOSIS — Z6838 Body mass index (BMI) 38.0-38.9, adult: Secondary | ICD-10-CM

## 2019-06-15 DIAGNOSIS — E119 Type 2 diabetes mellitus without complications: Secondary | ICD-10-CM | POA: Diagnosis not present

## 2019-06-15 DIAGNOSIS — E781 Pure hyperglyceridemia: Secondary | ICD-10-CM

## 2019-06-15 DIAGNOSIS — M25562 Pain in left knee: Secondary | ICD-10-CM

## 2019-06-15 DIAGNOSIS — E1121 Type 2 diabetes mellitus with diabetic nephropathy: Secondary | ICD-10-CM | POA: Diagnosis not present

## 2019-06-15 DIAGNOSIS — E785 Hyperlipidemia, unspecified: Secondary | ICD-10-CM

## 2019-06-15 DIAGNOSIS — E039 Hypothyroidism, unspecified: Secondary | ICD-10-CM | POA: Diagnosis not present

## 2019-06-15 DIAGNOSIS — M1711 Unilateral primary osteoarthritis, right knee: Secondary | ICD-10-CM | POA: Diagnosis not present

## 2019-06-15 MED ORDER — LEVOTHYROXINE SODIUM 137 MCG PO TABS: 137.0000 ug | ORAL_TABLET | Freq: Every day | ORAL | 1 refills | Status: DC

## 2019-06-15 MED ORDER — FLUOXETINE HCL 20 MG PO CAPS: ORAL_CAPSULE | ORAL | 1 refills | Status: DC

## 2019-06-15 MED ORDER — ATORVASTATIN CALCIUM 10 MG PO TABS: 10.0000 mg | ORAL_TABLET | Freq: Every day | ORAL | 1 refills | Status: DC

## 2019-06-15 MED ORDER — LISINOPRIL-HYDROCHLOROTHIAZIDE 20-25 MG PO TABS: 1.0000 | ORAL_TABLET | Freq: Every day | ORAL | 1 refills | Status: DC

## 2019-06-15 MED ORDER — METFORMIN HCL 500 MG PO TABS: 500.0000 mg | ORAL_TABLET | Freq: Two times a day (BID) | ORAL | 1 refills | Status: DC

## 2019-06-15 NOTE — Progress Notes (Signed)
Subjective:  Patient ID: Jackson Sellers, male    DOB: June 20, 1945  Age: 74 y.o. MRN: [id number]  CC:  Chief Complaint  Patient presents with  . Follow-up    on hypertenstion, diabetes, hypothyroidism, and hyperlipedimia. pt hasn't had any issues with these condidtions since last visit. pt checks his BP once a month and pt dosen't che BS at all. no unexplained waightloss.  . Referral    pt would like a referral to ortho for his knees. pt states he has had lots of pain while walking . pain is more so on his R knee. pt states he was informed to let us know so we can put in a refferal.     HPI Jackson Sellers presents for  Hypertension: Lisinopril HCTZ 20/25 mg daily Home readings: none.  No new side effects. No regular exercise  - limited by knee pain.  Rare fast food, no alcohol, no soda/sweet tea.  No nsaids.  Had both covid vaccines.   BP Readings from Last 3 Encounters:  06/15/19 134/80  12/22/18 112/69  05/12/18 124/74   Lab Results  Component Value Date   CREATININE 1.35 (H) 12/22/2018   Hypothyroidism: Lab Results  Component Value Date   TSH 0.546 05/12/2018  Synthroid 137 mcg daily. Taking medication daily.  No new hot or cold intolerance. No new hair or skin changes, heart palpitations or new fatigue. No new weight changes.  Wt Readings from Last 3 Encounters:  06/15/19 261 lb (118.4 kg)  12/22/18 269 lb (122 kg)  05/12/18 261 lb 3.2 oz (118.5 kg)     Hyperlipidemia: Lipitor 10 mg daily.  Significant hypertriglyceridemia in September.  Up from 378 in February to 628. Not fasting at that time, lab only order was placed.  Has not been done. No new side effects, or new myalgias.    Lab Results  Component Value Date   CHOL 195 12/22/2018   HDL 33 (L) 12/22/2018   LDLCALC 66 12/22/2018   TRIG 628 (HH) 12/22/2018   CHOLHDL 5.9 (H) 12/22/2018   Lab Results  Component Value Date   ALT 12 12/22/2018   AST 16 12/22/2018   ALKPHOS 62 12/22/2018   BILITOT  0.2 12/22/2018   Bilateral knee pain Letter osteoarthritis on left knee imaging in 2018. Both knees sore for some time, R worse. Pain for years, but more pain with walking with R knee pain past few months. Using cane. No locking/giving way.  Tx: none.  Hurt knee years ago  At [employer], had injection by [facility]. Did better for awhile. Not sure of name of provider, possibly Dr. Theda Sers.   Anxiety: Fluoxetine 20 mg daily.  Doing well on current dose.  Denies anxiety, or depressed mood.  Depression screen [facility] 2/9 06/15/2019 12/22/2018 05/12/2018 11/22/2017 04/28/2017  Decreased Interest 0 0 0 0 0  Down, Depressed, Hopeless 0 0 0 0 0  PHQ - 2 Score 0 0 0 0 0    Diabetes: With hyperglycemia, CKD, EGFR 52 in September.  Creatinine range 1.2-1.35 since 2017.   Last A1c 7.4 in September 2020 Metformin 500 mg twice daily.  He is on ACE inhibitor with lisinopril, statin with Lipitor. Microalbumin: Normal ratio 05/12/2018 Optho, foot exam, pneumovax: Up-to-date  Lab Results  Component Value Date   HGBA1C 7.4 (H) 12/22/2018   HGBA1C 7.0 (H) 05/12/2018   HGBA1C 7.3 (H) 11/22/2017   Lab Results  Component Value Date   MICROALBUR 0.3 04/10/2015   [place]  66 12/22/2018   CREATININE 1.35 (H) 12/22/2018     History Patient Active Problem List   Diagnosis Date Noted  . Vertigo 04/12/2012  . Diabetes ([place]) 02/22/2012  . Hypertension 02/22/2012  . GERD (gastroesophageal reflux disease) 02/22/2012  . Hyperlipidemia 02/22/2012  . Hypothyroid 02/22/2012  . Arthritis 02/22/2012   Past Medical History:  Diagnosis Date  . Arthritis   . Cataract   . Diabetes mellitus without complication ([place])   . GERD (gastroesophageal reflux disease)   . Hypertension   . Thyroid disease    Past Surgical History:  Procedure Laterality Date  . EYE SURGERY    . HERNIA REPAIR     Allergies  Allergen Reactions  . Metoclopramide Hcl Other (See Comments)    Pt states that he" felt like something was  crawling all over me"  . Erythrocin Rash  . Penicillins Rash    Has patient had a PCN reaction causing immediate rash, facial/tongue/throat swelling, SOB or lightheadedness with hypotension: No Has patient had a PCN reaction causing severe rash involving mucus membranes or skin necrosis: No Has patient had a PCN reaction that required hospitalization No Has patient had a PCN reaction occurring within the last 10 years:No If all of the above answers are "NO", then may proceed with Cephalosporin use.    Prior to Admission medications   Medication Sig Start Date End Date Taking? Authorizing Provider  aspirin 81 MG tablet Take 1 tablet (81 mg total) by mouth daily. 05/26/11  Yes Darlyne Russian, MD  atorvastatin (LIPITOR) 10 MG tablet Take 1 tablet (10 mg total) by mouth daily. 12/22/18  Yes Wendie Agreste, MD  cholecalciferol (VITAMIN D) 1000 UNITS tablet Take 1 tablet (1,000 Units total) by mouth 2 (two) times daily. 05/26/11  Yes Darlyne Russian, MD  FLUoxetine (PROZAC) 20 MG capsule TAKE 1 CAPSULE BY MOUTH EVERY DAY 06/15/19  Yes Wendie Agreste, MD  levothyroxine (SYNTHROID) 137 MCG tablet Take 1 tablet (137 mcg total) by mouth daily before breakfast. 12/22/18  Yes Wendie Agreste, MD  lisinopril-hydrochlorothiazide (ZESTORETIC) 20-25 MG tablet Take 1 tablet by mouth daily. 12/22/18  Yes Wendie Agreste, MD  metFORMIN (GLUCOPHAGE) 500 MG tablet Take 1 tablet (500 mg total) by mouth 2 (two) times daily with a meal. 12/22/18  Yes Wendie Agreste, MD   Social History   Socioeconomic History  . Marital status: Married    Spouse name: Not on file  . Number of children: Not on file  . Years of education: Not on file  . Highest education level: Not on file  Occupational History  . Occupation: Retired  Tobacco Use  . Smoking status: Never Smoker  . Smokeless tobacco: Never Used  Substance and Sexual Activity  . Alcohol use: No  . Drug use: No  . Sexual activity: Yes  Other Topics  Concern  . Not on file  Social History Narrative   Married   Education: Secretary/administrator   Exercise: Yes   Social Determinants of Health   Financial Resource Strain:   . Difficulty of Paying Living Expenses:   Food Insecurity:   . Worried About Charity fundraiser in the Last Year:   . Arboriculturist in the Last Year:   Transportation Needs:   . Film/video editor (Medical):   Marland Kitchen Lack of Transportation (Non-Medical):   Physical Activity:   . Days of Exercise per Week:   . Minutes of Exercise per Session:  Stress:   . Feeling of Stress :   Social Connections:   . Frequency of Communication with Friends and Family:   . Frequency of Social Gatherings with Friends and Family:   . Attends Religious Services:   . Active Member of Clubs or Organizations:   . Attends Archivist Meetings:   Marland Kitchen Marital Status:   Intimate Partner Violence:   . Fear of Current or Ex-Partner:   . Emotionally Abused:   Marland Kitchen Physically Abused:   . Sexually Abused:     Review of Systems  Constitutional: Negative for fatigue and unexpected weight change.  Eyes: Negative for visual disturbance.  Respiratory: Negative for cough, chest tightness and shortness of breath.   Cardiovascular: Negative for chest pain, palpitations and leg swelling.  Gastrointestinal: Negative for abdominal pain and blood in stool.  Neurological: Negative for dizziness, light-headedness and headaches.     Objective:   Vitals:   06/15/19 1108 06/15/19 1109  BP: (!) 158/88 134/80  Pulse: 88   Temp: (!) 97.2 F (36.2 C)   TempSrc: Temporal   SpO2: 94%   Weight: 261 lb (118.4 kg)   Height: '5\' 9"'  (1.753 m)     Physical Exam Vitals reviewed.  Constitutional:      Appearance: He is well-developed.  HENT:     Head: Normocephalic and atraumatic.  Eyes:     Pupils: Pupils are equal, round, and reactive to light.  Neck:     Vascular: No carotid bruit or JVD.  Cardiovascular:     Rate and Rhythm: Normal rate and  regular rhythm.     Heart sounds: Normal heart sounds. No murmur.  Pulmonary:     Effort: Pulmonary effort is normal.     Breath sounds: Normal breath sounds. No rales.  Musculoskeletal:     Right knee: Bony tenderness (Primarily joint line, some tenderness of the proximal tibia medially.) present. No swelling. Decreased range of motion (90 degrees flexion, full extension). Tenderness present over the medial joint line (Medial greater than lateral joint line.) and lateral joint line. No patellar tendon tenderness. No LCL laxity or MCL laxity. Normal patellar mobility.     Left knee: No swelling.  Skin:    General: Skin is warm and dry.  Neurological:     Mental Status: He is alert and oriented to person, place, and time.      DG Knee Complete 4 Views Right  Result Date: 06/15/2019 CLINICAL DATA:  Knee pain. EXAM: RIGHT KNEE - COMPLETE 4+ VIEW COMPARISON:  No prior. FINDINGS: Severe tricompartment degenerative change. Loose bodies most likely present. No acute bony abnormality identified. No evidence of fracture. Peripheral vascular calcification. IMPRESSION: 1. Severe tricompartment degenerative change. Loose bodies most likely present. No acute bony abnormality identified. 2.  Peripheral vascular disease. Electronically Signed   By: Marcello Moores  Register   On: 06/15/2019 12:38     Assessment & Plan:  Jackson Sellers is a 74 y.o. male . Type 2 diabetes mellitus with stage 3a chronic kidney disease, without long-term current use of insulin ([place]) - Plan: Hemoglobin A1c, Microalbumin / creatinine urine ratio  -Previous borderline control, repeat labs, continue same meds.  Pain in both knees, unspecified chronicity - Plan: AMB referral to orthopedics, DG Knee Complete 4 Views Right  -Degenerative changes previously noted in his left knee, but primarily has right knee pain that has been progressive.  X-ray reflects severe tricompartmental degenerative disease, will refer to orthopedics to discuss  options with injection  versus surgical intervention and timing.  Okay to try Tylenol initially for now.  Cane for assistance.  Decided against offloader brace at this time with bilateral pain.  Can be discussed with orthopedics.  RTC precautions.   Anxiety state  -Stable, continue fluoxetine  Hypothyroidism, unspecified type  -Tolerating current regimen without new symptoms, check TSH continue same dose Synthroid.  Essential hypertension - Plan: Comprehensive metabolic panel  -Stable, meds refilled.  Check creatinine with CKD stage III.  Avoid NSAIDs, maintain hydration.  Hypertriglyceridemia - Plan: Comprehensive metabolic panel, Lipid panel  -Continue Lipitor same dose, repeat labs fasting.  Previous hypertriglyceridemia likely due in part to nonfasting state.  Class 2 severe obesity with serious comorbidity and body mass index (BMI) of 38.0 to 38.9 in adult, unspecified obesity type ([place])  -Exercise limited by knee pain.  No orders of the defined types were placed in this encounter.  Patient Instructions    Tylenol up to 4 times per day as needed for right knee pain, arthritis.  I did place a referral to orthopedics to discuss injections or other treatments.  Okay to use cane for stability for now as well.  I will let you know if there are concerns on your x-ray.  I expect it will show some arthritis.  No change in medications today.  Continue to watch diet, hopefully knee treatment will allow some more activity/walking.  I will check some blood work to determine if any changes in medications needed.  Continue to avoid NSAIDs such as ibuprofen or naproxen as this can worsen kidney issues.  Continue to stay hydrated with fluids, especially as the warm weather returns.  Recheck in 6 months if A1c is stable.  Let me know if there are questions sooner.    Chronic Knee Pain, Adult Chronic knee pain is pain in one or both knees that lasts longer than 3 months. Symptoms of chronic knee  pain may include swelling, stiffness, and discomfort. Age-related wear and tear (osteoarthritis) of the knee joint is the most common cause of chronic knee pain. Other possible causes include:  A long-term immune-related disease that causes inflammation of the knee (rheumatoid arthritis). This usually affects both knees.  Inflammatory arthritis, such as gout or pseudogout.  An injury to the knee that causes arthritis.  An injury to the knee that damages the ligaments. Ligaments are strong tissues that connect bones to each other.  Runner's knee or pain behind the kneecap. Treatment for chronic knee pain depends on the cause. The main treatments for chronic knee pain are physical therapy and weight loss. This condition may also be treated with medicines, injections, a knee sleeve or brace, and by using crutches. Rest, ice, compression (pressure), and elevation (RICE) therapy may also be recommended. Follow these instructions at home: If you have a knee sleeve or brace:   Wear it as told by your health care provider. Remove it only as told by your health care provider.  Loosen it if your toes tingle, become numb, or turn cold and blue.  Keep it clean.  If the sleeve or brace is not waterproof: ? Do not let it get wet. ? Remove it if allowed by your health care provider, or cover it with a watertight covering when you take a bath or a shower. Managing pain, stiffness, and swelling      If directed, apply heat to the affected area as often as told by your health care provider. Use the heat source that your health  care provider recommends, such as a moist heat pack or a heating pad. ? If you have a removable sleeve or brace, remove it as told by your health care provider. ? Place a towel between your skin and the heat source. ? Leave the heat on for 20-30 minutes. ? Remove the heat if your skin turns bright red. This is especially important if you are unable to feel pain, heat, or cold.  You may have a greater risk of getting burned.  If directed, put ice on the affected area. ? If you have a removable sleeve or brace, remove it as told by your health care provider. ? Put ice in a plastic bag. ? Place a towel between your skin and the bag. ? Leave the ice on for 20 minutes, 2-3 times a day.  Move your toes often to reduce stiffness and swelling.  Raise (elevate) the injured area above the level of your heart while you are sitting or lying down. Activity  Avoid activities where both feet leave the ground at the same time (high-impact activities). Examples are running, jumping rope, and doing jumping jacks.  Return to your normal activities as told by your health care provider. Ask your health care provider what activities are safe for you.  Follow the exercise plan that your health care provider designed for you. Your health care provider may suggest that you: ? Avoid activities that make knee pain worse. This may require you to change your exercise routines, sport participation, or job duties. ? Wear shoes with cushioned soles. ? Avoid high-impact activities or sports that require running and sudden changes in direction. ? Do physical therapy as told by your health care provider. Physical therapy is planned to match your needs and abilities. It may include exercises for strength, flexibility, stability, and endurance. ? Do exercises that increase balance and strength, such as tai chi and yoga.  Do not use the injured limb to support your body weight until your health care provider says that you can. Use crutches, a cane, or a walker, as told by your health care provider. General instructions  Take over-the-counter and prescription medicines only as told by your health care provider.  Lose weight if you are overweight. Losing even a little weight can reduce knee pain. Ask your health care provider what your ideal weight is, and how to safely lose extra weight. A food  expert (dietitian) may be able to help you plan your meals.  Do not use any products that contain nicotine or tobacco, such as cigarettes, e-cigarettes, and chewing tobacco. These can delay healing. If you need help quitting, ask your health care provider.  Keep all follow-up visits as told by your health care provider. This is important. Contact a health care provider if:  You have knee pain that is not getting better or gets worse.  You are unable to do your physical therapy exercises due to knee pain. Get help right away if:  Your knee swells and the swelling becomes worse.  You cannot move your knee.  You have severe knee pain. Summary  Knee pain that lasts more than 3 months is considered chronic knee pain.  The main treatments for chronic knee pain are physical therapy and weight loss. You may also need to take medicines, wear a knee sleeve or brace, use crutches, and apply ice or heat.  Losing even a little weight can reduce knee pain. Ask your health care provider what your ideal weight is, and  how to safely lose extra weight. A food expert (dietitian) may be able to help you plan your meals.  Work with a physical therapist to make a safe exercise program, as told by your health care provider. This information is not intended to replace advice given to you by your health care provider. Make sure you discuss any questions you have with your health care provider. Document Revised: 05/25/2018 Document Reviewed: 05/25/2018 Elsevier Patient Education  [employer].   If you have lab work done today you will be contacted with your lab results within the next 2 weeks.  If you have not heard from [place] then please contact us. The fastest way to get your results is to register for My Chart.   IF you received an x-ray today, you will receive an invoice from [facility]. Please contact [facility] at [phone] with questions or concerns regarding your invoice.    IF you received labwork today, you will receive an invoice from [facility]. Please contact [facility] at [phone] with questions or concerns regarding your invoice.   Our billing staff will not be able to assist you with questions regarding bills from these companies.  You will be contacted with the lab results as soon as they are available. The fastest way to get your results is to activate your My Chart account. Instructions are located on the last page of this paperwork. If you have not heard from [place] regarding the results in 2 weeks, please contact this office.          Signed, Merri Ray, MD [facility]

## 2019-06-15 NOTE — Patient Instructions (Addendum)
Tylenol up to 4 times per day as needed for right knee pain, arthritis.  I did place a referral to orthopedics to discuss injections or other treatments.  Okay to use cane for stability for now as well.  I will let you know if there are concerns on your x-ray.  I expect it will show some arthritis.  No change in medications today.  Continue to watch diet, hopefully knee treatment will allow some more activity/walking.  I will check some blood work to determine if any changes in medications needed.  Continue to avoid NSAIDs such as ibuprofen or naproxen as this can worsen kidney issues.  Continue to stay hydrated with fluids, especially as the warm weather returns.  Recheck in 6 months if A1c is stable.  Let me know if there are questions sooner.    Chronic Knee Pain, Adult Chronic knee pain is pain in one or both knees that lasts longer than 3 months. Symptoms of chronic knee pain may include swelling, stiffness, and discomfort. Age-related wear and tear (osteoarthritis) of the knee joint is the most common cause of chronic knee pain. Other possible causes include:  A long-term immune-related disease that causes inflammation of the knee (rheumatoid arthritis). This usually affects both knees.  Inflammatory arthritis, such as gout or pseudogout.  An injury to the knee that causes arthritis.  An injury to the knee that damages the ligaments. Ligaments are strong tissues that connect bones to each other.  Runner's knee or pain behind the kneecap. Treatment for chronic knee pain depends on the cause. The main treatments for chronic knee pain are physical therapy and weight loss. This condition may also be treated with medicines, injections, a knee sleeve or brace, and by using crutches. Rest, ice, compression (pressure), and elevation (RICE) therapy may also be recommended. Follow these instructions at home: If you have a knee sleeve or brace:   Wear it as told by your health care provider.  Remove it only as told by your health care provider.  Loosen it if your toes tingle, become numb, or turn cold and blue.  Keep it clean.  If the sleeve or brace is not waterproof: ? Do not let it get wet. ? Remove it if allowed by your health care provider, or cover it with a watertight covering when you take a bath or a shower. Managing pain, stiffness, and swelling      If directed, apply heat to the affected area as often as told by your health care provider. Use the heat source that your health care provider recommends, such as a moist heat pack or a heating pad. ? If you have a removable sleeve or brace, remove it as told by your health care provider. ? Place a towel between your skin and the heat source. ? Leave the heat on for 20-30 minutes. ? Remove the heat if your skin turns bright red. This is especially important if you are unable to feel pain, heat, or cold. You may have a greater risk of getting burned.  If directed, put ice on the affected area. ? If you have a removable sleeve or brace, remove it as told by your health care provider. ? Put ice in a plastic bag. ? Place a towel between your skin and the bag. ? Leave the ice on for 20 minutes, 2-3 times a day.  Move your toes often to reduce stiffness and swelling.  Raise (elevate) the injured area above the level of your  heart while you are sitting or lying down. Activity  Avoid activities where both feet leave the ground at the same time (high-impact activities). Examples are running, jumping rope, and doing jumping jacks.  Return to your normal activities as told by your health care provider. Ask your health care provider what activities are safe for you.  Follow the exercise plan that your health care provider designed for you. Your health care provider may suggest that you: ? Avoid activities that make knee pain worse. This may require you to change your exercise routines, sport participation, or job  duties. ? Wear shoes with cushioned soles. ? Avoid high-impact activities or sports that require running and sudden changes in direction. ? Do physical therapy as told by your health care provider. Physical therapy is planned to match your needs and abilities. It may include exercises for strength, flexibility, stability, and endurance. ? Do exercises that increase balance and strength, such as tai chi and yoga.  Do not use the injured limb to support your body weight until your health care provider says that you can. Use crutches, a cane, or a walker, as told by your health care provider. General instructions  Take over-the-counter and prescription medicines only as told by your health care provider.  Lose weight if you are overweight. Losing even a little weight can reduce knee pain. Ask your health care provider what your ideal weight is, and how to safely lose extra weight. A food expert (dietitian) may be able to help you plan your meals.  Do not use any products that contain nicotine or tobacco, such as cigarettes, e-cigarettes, and chewing tobacco. These can delay healing. If you need help quitting, ask your health care provider.  Keep all follow-up visits as told by your health care provider. This is important. Contact a health care provider if:  You have knee pain that is not getting better or gets worse.  You are unable to do your physical therapy exercises due to knee pain. Get help right away if:  Your knee swells and the swelling becomes worse.  You cannot move your knee.  You have severe knee pain. Summary  Knee pain that lasts more than 3 months is considered chronic knee pain.  The main treatments for chronic knee pain are physical therapy and weight loss. You may also need to take medicines, wear a knee sleeve or brace, use crutches, and apply ice or heat.  Losing even a little weight can reduce knee pain. Ask your health care provider what your ideal weight is, and  how to safely lose extra weight. A food expert (dietitian) may be able to help you plan your meals.  Work with a physical therapist to make a safe exercise program, as told by your health care provider. This information is not intended to replace advice given to you by your health care provider. Make sure you discuss any questions you have with your health care provider. Document Revised: 05/25/2018 Document Reviewed: 05/25/2018 Elsevier Patient Education  [employer].   If you have lab work done today you will be contacted with your lab results within the next 2 weeks.  If you have not heard from [place] then please contact us. The fastest way to get your results is to register for My Chart.   IF you received an x-ray today, you will receive an invoice from [facility]. Please contact [facility] at [phone] with questions or concerns regarding your invoice.   IF you  received labwork today, you will receive an invoice from [place]. Please contact [facility] at [phone] with questions or concerns regarding your invoice.   Our billing staff will not be able to assist you with questions regarding bills from these companies.  You will be contacted with the lab results as soon as they are available. The fastest way to get your results is to activate your My Chart account. Instructions are located on the last page of this paperwork. If you have not heard from [place] regarding the results in 2 weeks, please contact this office.

## 2019-06-16 LAB — COMPREHENSIVE METABOLIC PANEL
ALT: 12 IU/L (ref 0–44)
AST: 16 IU/L (ref 0–40)
Albumin/Globulin Ratio: 2.4 — ABNORMAL HIGH (ref 1.2–2.2)
Albumin: 4.8 g/dL — ABNORMAL HIGH (ref 3.7–4.7)
Alkaline Phosphatase: 66 IU/L (ref 39–117)
BUN/Creatinine Ratio: 13 (ref 10–24)
BUN: 19 mg/dL (ref 8–27)
Bilirubin Total: 0.5 mg/dL (ref 0.0–1.2)
CO2: 19 mmol/L — ABNORMAL LOW (ref 20–29)
Calcium: 9.4 mg/dL (ref 8.6–10.2)
Chloride: 100 mmol/L (ref 96–106)
Creatinine, Ser: 1.5 mg/dL — ABNORMAL HIGH (ref 0.76–1.27)
GFR calc Af Amer: 52 mL/min/{1.73_m2} — ABNORMAL LOW (ref >59)
GFR calc non Af Amer: 45 mL/min/{1.73_m2} — ABNORMAL LOW (ref >59)
Globulin, Total: 2 g/dL (ref 1.5–4.5)
Glucose: 126 mg/dL — ABNORMAL HIGH (ref 65–99)
Potassium: 4.7 mmol/L (ref 3.5–5.2)
Sodium: 140 mmol/L (ref 134–144)
Total Protein: 6.8 g/dL (ref 6.0–8.5)

## 2019-06-16 LAB — HEMOGLOBIN A1C
Est. average glucose Bld gHb Est-mCnc: 169 mg/dL
Hgb A1c MFr Bld: 7.5 % — ABNORMAL HIGH (ref 4.8–5.6)

## 2019-06-16 LAB — LIPID PANEL
Chol/HDL Ratio: 5.3 ratio — ABNORMAL HIGH (ref 0.0–5.0)
Cholesterol, Total: 206 mg/dL — ABNORMAL HIGH (ref 100–199)
HDL: 39 mg/dL — ABNORMAL LOW (ref >39)
LDL Chol Calc (NIH): 105 mg/dL — ABNORMAL HIGH (ref 0–99)
Triglycerides: 366 mg/dL — ABNORMAL HIGH (ref 0–149)
VLDL Cholesterol Cal: 62 mg/dL — ABNORMAL HIGH (ref 5–40)

## 2019-06-16 LAB — TSH: TSH: 1.24 u[IU]/mL (ref 0.450–4.500)

## 2019-06-18 ENCOUNTER — Telehealth: Payer: Self-pay | Admitting: Family Medicine

## 2019-06-18 NOTE — Telephone Encounter (Signed)
Pt called and his referral to ortho that the provider referred him to is needing his X-rays from apt on 3/19 for his apt with them tomorrow 3/23 at 10am. Pt would like a call when they are ready for pick up. [phone] Please advise.

## 2019-06-19 DIAGNOSIS — M25561 Pain in right knee: Secondary | ICD-10-CM | POA: Diagnosis not present

## 2019-06-19 DIAGNOSIS — M1711 Unilateral primary osteoarthritis, right knee: Secondary | ICD-10-CM | POA: Diagnosis not present

## 2019-06-19 DIAGNOSIS — M170 Bilateral primary osteoarthritis of knee: Secondary | ICD-10-CM | POA: Diagnosis not present

## 2019-06-19 DIAGNOSIS — M25562 Pain in left knee: Secondary | ICD-10-CM | POA: Diagnosis not present

## 2019-06-22 ENCOUNTER — Ambulatory Visit: Payer: PPO | Admitting: Family Medicine

## 2019-06-30 ENCOUNTER — Other Ambulatory Visit: Payer: Self-pay | Admitting: Family Medicine

## 2019-06-30 DIAGNOSIS — E1122 Type 2 diabetes mellitus with diabetic chronic kidney disease: Secondary | ICD-10-CM

## 2019-07-03 NOTE — Telephone Encounter (Signed)
Pt Returning call for his lab Result.Please Advise

## 2019-07-05 ENCOUNTER — Telehealth: Payer: Self-pay | Admitting: *Deleted

## 2019-07-05 NOTE — Telephone Encounter (Signed)
Declined AWV  °

## 2019-07-12 ENCOUNTER — Other Ambulatory Visit: Payer: Self-pay

## 2019-07-12 NOTE — Patient Outreach (Signed)
[facility]) Care Management Chronic Special Needs Program  07/12/2019  Name: CROIX PRESLEY DOB: [birth year]  MRN: [id number]  Mr. Marvon Shillingburg is enrolled in a chronic special needs plan for Diabetes. Chronic Care Management Coordinator telephoned client to review health risk assessment and to develop individualized care plan. HIPAA verified.  Introduced the chronic care management program, importance of client participation, and taking their care plan to all provider appointments and inpatient facilities.    Subjective: Client states he recently saw his orthopedic doctor and had cortisone injection in his knees. He reports this has helped and he has not had to use his cane for ambulation. Client reports recent visit with his primary care provider. He states he sees his provider every 6 weeks. Client reports he does not have check his blood sugar or own a glucometer. Client states his doctor is aware of this and is ok with it. Reports taking oral diabetic medication. Client states he lost 9 lbs recently by eating healthier and cutting out in between meal snacking.  Client states he has received his COVID vaccine shots and will schedule to have the shingles vaccine. RNCM discussed diabetes self management and importance of monitoring blood sugars. Discussed diabetic foot care and heart healthy diet with client. Client verbalized understanding.    Goals Addressed            This Visit's Progress   . "I want to bring my A1c down (pt-stated)       Discussed diabetes self management actions:  Glucose monitoring per provider recommendation  Visit provider every 3-6 months as directed  Hbg A1C level every 3-6 months.  Carbohydrate controlled meal planning  Taking diabetes medication as prescribed by provider  Physical activity     . Client understands the importance of follow-up with providers by attending scheduled visits       Most recent primary care provider  visit: 06/15/19 Most recent eye doctor appointment 03/01/19 Continue to maintain and keep follow up visits with your providers.     . Client will verbalize knowledge of self management of Hypertension as evidences by BP reading of 140/90 or less; or as defined by provider       RN will send client education article: High blood pressure: What you can do and Diabetes and blood pressure Take medications as prescribed.  Please as your provider, "what is my target blood pressure range." Follow up with provider as recommended    . HEMOGLOBIN A1C < 7.0       Discussed diabetes self management actions:  Glucose monitoring per provider recommendation  Visit provider every 3-6 months as directed  Hbg A1C level every 3-6 months.  Eye Exam yearly  Carbohydrate controlled meal planning  Taking diabetes medication as prescribed by provider  Physical activity     . Maintain timely refills of diabetic medication as prescribed within the year .       Review of medical record indicates client maintains timely refills of diabetic medications Take medications as prescribed Follow up with your doctor if you have questions Contact your assigned RN case manager if you have difficulty obtaining medications.     . Obtain annual  Lipid Profile, LDL-C       Lipid profile completed 06/15/19 The goal for LDL is less than 70 mg/ dl as you are at high risk complications try to avoid saturated fats, trans-fats, and eat more fiber RN case manager will send client education article: Heart Healthy  diet.     . Obtain Annual Eye (retinal)  Exam        Eye exam completed 03/01/19 Plan to schedule your eye exam yearly     . Obtain Annual Foot Exam       Most recent foot exam 12/22/18 Diabetes foot care - Check feet daily at home (look for skin color changes, cuts, sores or cracks in the skin, swelling of feet or ankles, ingrown or fungal toenails, corn or calluses). Report these findings to your doctor - Wash  feet with soap and water, dry feet well especially between toes - Moisturize your feet but not between the toes - Always wear shoes that protect your whole feet.      . Obtain annual screen for micro albuminuria (urine) , nephropathy (kidney problems)       Most recent micro albuminuria 05/12/18 Attend yearly physicals and follow up visit with your providers and complete labs as recommended.     . Obtain Hemoglobin A1C at least 2 times per year       Most recent Hgb A1c 06/15/19, 12/23/18 Continue to keep your follow up appointments with your provider and have lab work completed as recommended.     . Visit Primary Care Provider or Endocrinologist at least 2 times per year        Most recent primary care provider visits 06/15/19, 12/22/19 Plan to schedule yearly physical and follow up appointments with your provider as recommended.        Assessment: Client is not meeting diabetes self-management goal of hemoglobin A1C of <7.0% with most recent reading of 7.5% on 06/15/19   Plan:  Send successful outreach letter with a copy of their individualized care plan, Send individual care plan to provider and Send educational material  Chronic care management coordination will outreach in:  12 months   George Ina RN,BSN,CCM Chronic Care Management Coordinator [facility] Care Management [phone]

## 2019-12-05 ENCOUNTER — Other Ambulatory Visit: Payer: Self-pay | Admitting: Family Medicine

## 2019-12-05 DIAGNOSIS — E039 Hypothyroidism, unspecified: Secondary | ICD-10-CM

## 2019-12-05 DIAGNOSIS — E119 Type 2 diabetes mellitus without complications: Secondary | ICD-10-CM

## 2019-12-05 DIAGNOSIS — I10 Essential (primary) hypertension: Secondary | ICD-10-CM

## 2019-12-05 NOTE — Telephone Encounter (Signed)
Requested Prescriptions  Pending Prescriptions Disp Refills   lisinopril-hydrochlorothiazide (ZESTORETIC) 20-25 MG tablet [Pharmacy Med Name: LISINOPRIL-HCTZ 20-25 MG TAB] 90 tablet 1    Sig: TAKE 1 TABLET BY MOUTH EVERY DAY     Cardiovascular:  ACEI + Diuretic Combos Failed - 12/05/2019  1:27 AM      Failed - Cr in normal range and within 180 days    Creat  Date Value Ref Range Status  04/10/2015 1.21 0.70 - 1.25 mg/dL Final   Creatinine, Ser  Date Value Ref Range Status  06/15/2019 1.50 (H) 0.76 - 1.27 mg/dL Final         Passed - Na in normal range and within 180 days    Sodium  Date Value Ref Range Status  06/15/2019 140 134 - 144 mmol/L Final         Passed - K in normal range and within 180 days    Potassium  Date Value Ref Range Status  06/15/2019 4.7 3.5 - 5.2 mmol/L Final         Passed - Ca in normal range and within 180 days    Calcium  Date Value Ref Range Status  06/15/2019 9.4 8.6 - 10.2 mg/dL Final         Passed - Patient is not pregnant      Passed - Last BP in normal range    BP Readings from Last 1 Encounters:  06/15/19 134/80         Passed - Valid encounter within last 6 months    Recent Outpatient Visits          5 months ago Type 2 diabetes mellitus with stage 3a chronic kidney disease, without long-term current use of insulin ([place])   [facility] at [place], Ranell Patrick, MD   11 months ago Impacted cerumen of right ear   [facility] at [place] [facility], Ranell Patrick, MD   1 year ago Medicare annual wellness visit, subsequent   [facility] at [place], Ranell Patrick, MD   2 years ago Hyperlipidemia, unspecified hyperlipidemia type   [facility] at [place] [facility], Ranell Patrick, MD   2 years ago Type 2 diabetes mellitus without complication, without long-term current use of insulin [facility])   [facility] at [place], Ranell Patrick, MD      Future Appointments            In 1 week Carlota Raspberry Ranell Patrick, MD Primary Care at [place], [facility]             metFORMIN (GLUCOPHAGE) 500 MG tablet [Pharmacy Med Name: METFORMIN HCL 500 MG TABLET] 180 tablet 1    Sig: TAKE 1 TABLET BY MOUTH TWICE A DAY WITH A MEAL     Endocrinology:  Diabetes - Biguanides Failed - 12/05/2019  1:27 AM      Failed - Cr in normal range and within 360 days    Creat  Date Value Ref Range Status  04/10/2015 1.21 0.70 - 1.25 mg/dL Final   Creatinine, Ser  Date Value Ref Range Status  06/15/2019 1.50 (H) 0.76 - 1.27 mg/dL Final         Failed - eGFR in normal range and within 360 days    GFR, Est African American  Date Value Ref Range Status  04/10/2015 70 >=60 mL/min Final   GFR calc Af Amer  Date Value Ref Range Status  06/15/2019 52 (L) >59 mL/min/1.73 Final   GFR, Est Non African American  Date Value Ref Range Status  04/10/2015 61 >=60 mL/min Final    Comment:      The estimated GFR is a calculation valid for adults (>=41 years old) that uses the CKD-EPI algorithm to adjust for age and sex. It is   not to be used for children, pregnant women, hospitalized patients,    patients on dialysis, or with rapidly changing kidney function. According to the NKDEP, eGFR >89 is normal, 60-89 shows mild impairment, 30-59 shows moderate impairment, 15-29 shows severe impairment and <15 is ESRD.      GFR calc non Af Amer  Date Value Ref Range Status  06/15/2019 45 (L) >59 mL/min/1.73 Final         Passed - HBA1C is between 0 and 7.9 and within 180 days    Hgb A1c MFr Bld  Date Value Ref Range Status  06/15/2019 7.5 (H) 4.8 - 5.6 % Final    Comment:             Prediabetes: 5.7 - 6.4          Diabetes: >6.4          Glycemic control for adults with diabetes: <7.0          Passed - Valid encounter within last 6 months    Recent Outpatient Visits          5 months ago Type 2 diabetes mellitus with stage 3a chronic kidney disease, without long-term current use of insulin ([place])   [facility] at [facility], Ranell Patrick, MD   11 months ago Impacted  cerumen of right ear   [facility] at [facility], Ranell Patrick, MD   1 year ago Medicare annual wellness visit, subsequent   [facility] at [facility], Ranell Patrick, MD   2 years ago Hyperlipidemia, unspecified hyperlipidemia type   [facility] at [place] [facility], Ranell Patrick, MD   2 years ago Type 2 diabetes mellitus without complication, without long-term current use of insulin [facility])   Primary Care at [place], MD      Future Appointments            In 1 week Wendie Agreste, MD Primary Care at [place], [facility]            levothyroxine (SYNTHROID) 137 MCG tablet [Pharmacy Med Name: LEVOTHYROXINE 137 MCG TABLET] 90 tablet 1    Sig: TAKE 1 TABLET (137 [place]) BY MOUTH DAILY BEFORE BREAKFAST.     Endocrinology:  Hypothyroid Agents Failed - 12/05/2019  1:27 AM      Failed - TSH needs to be rechecked within 3 months after an abnormal result. Refill until TSH is due.      Passed - TSH in normal range and within 360 days    TSH  Date Value Ref Range Status  06/15/2019 1.240 0.450 - 4.500 uIU/mL Final         Passed - Valid encounter within last 12 months    Recent Outpatient Visits          5 months ago Type 2 diabetes mellitus with stage 3a chronic kidney disease, without long-term current use of insulin [facility])   [facility] at [facility], Ranell Patrick, MD   11 months ago Impacted cerumen of right ear   [facility] at [place] [facility], Ranell Patrick, MD   1 year ago Medicare annual wellness visit, subsequent   [facility] at [facility], Ranell Patrick, MD  2 years ago Hyperlipidemia, unspecified hyperlipidemia type   [facility] at [place] [facility], Ranell Patrick, MD   2 years ago Type 2 diabetes mellitus without complication, without long-term current use of insulin [facility])   [facility] at [place], Ranell Patrick, MD      Future Appointments            In 1 week Carlota Raspberry Ranell Patrick, MD Primary Care at [place], [facility]

## 2019-12-10 ENCOUNTER — Ambulatory Visit (INDEPENDENT_AMBULATORY_CARE_PROVIDER_SITE_OTHER): Payer: HMO | Admitting: Emergency Medicine

## 2019-12-10 ENCOUNTER — Other Ambulatory Visit: Payer: Self-pay

## 2019-12-10 DIAGNOSIS — E1121 Type 2 diabetes mellitus with diabetic nephropathy: Secondary | ICD-10-CM

## 2019-12-10 DIAGNOSIS — N1831 Chronic kidney disease, stage 3a: Secondary | ICD-10-CM

## 2019-12-10 DIAGNOSIS — E1122 Type 2 diabetes mellitus with diabetic chronic kidney disease: Secondary | ICD-10-CM

## 2019-12-11 LAB — BASIC METABOLIC PANEL
BUN/Creatinine Ratio: 12 (ref 10–24)
BUN: 20 mg/dL (ref 8–27)
CO2: 19 mmol/L — ABNORMAL LOW (ref 20–29)
Calcium: 9.3 mg/dL (ref 8.6–10.2)
Chloride: 104 mmol/L (ref 96–106)
Creatinine, Ser: 1.67 mg/dL — ABNORMAL HIGH (ref 0.76–1.27)
GFR calc Af Amer: 46 mL/min/{1.73_m2} — ABNORMAL LOW (ref >59)
GFR calc non Af Amer: 40 mL/min/{1.73_m2} — ABNORMAL LOW (ref >59)
Glucose: 119 mg/dL — ABNORMAL HIGH (ref 65–99)
Potassium: 4.7 mmol/L (ref 3.5–5.2)
Sodium: 141 mmol/L (ref 134–144)

## 2019-12-14 ENCOUNTER — Other Ambulatory Visit: Payer: Self-pay

## 2019-12-14 ENCOUNTER — Encounter: Payer: Self-pay | Admitting: Family Medicine

## 2019-12-14 ENCOUNTER — Ambulatory Visit (INDEPENDENT_AMBULATORY_CARE_PROVIDER_SITE_OTHER): Payer: HMO | Admitting: Family Medicine

## 2019-12-14 VITALS — BP 140/80 | HR 90 | Temp 97.4°F | Ht 69.0 in | Wt 254.0 lb

## 2019-12-14 DIAGNOSIS — N1832 Chronic kidney disease, stage 3b: Secondary | ICD-10-CM

## 2019-12-14 DIAGNOSIS — E039 Hypothyroidism, unspecified: Secondary | ICD-10-CM | POA: Diagnosis not present

## 2019-12-14 DIAGNOSIS — E785 Hyperlipidemia, unspecified: Secondary | ICD-10-CM

## 2019-12-14 DIAGNOSIS — I10 Essential (primary) hypertension: Secondary | ICD-10-CM

## 2019-12-14 DIAGNOSIS — Z23 Encounter for immunization: Secondary | ICD-10-CM

## 2019-12-14 DIAGNOSIS — F411 Generalized anxiety disorder: Secondary | ICD-10-CM | POA: Diagnosis not present

## 2019-12-14 DIAGNOSIS — E1121 Type 2 diabetes mellitus with diabetic nephropathy: Secondary | ICD-10-CM

## 2019-12-14 DIAGNOSIS — E1122 Type 2 diabetes mellitus with diabetic chronic kidney disease: Secondary | ICD-10-CM

## 2019-12-14 LAB — POCT GLYCOSYLATED HEMOGLOBIN (HGB A1C): Hemoglobin A1C: 7.1 % — AB (ref 4.0–5.6)

## 2019-12-14 LAB — GLUCOSE, POCT (MANUAL RESULT ENTRY): POC Glucose: 110 mg/dl — AB (ref 70–99)

## 2019-12-14 MED ORDER — FLUOXETINE HCL 20 MG PO CAPS: ORAL_CAPSULE | ORAL | 1 refills | Status: DC

## 2019-12-14 MED ORDER — ATORVASTATIN CALCIUM 10 MG PO TABS: 10.0000 mg | ORAL_TABLET | Freq: Every day | ORAL | 1 refills | Status: DC

## 2019-12-14 NOTE — Progress Notes (Signed)
Subjective:  Patient ID: Jackson Sellers, male    DOB: [birth year]  Age: 74 y.o. MRN: [id number]  CC:  Chief Complaint  Patient presents with  . Follow-up    on diabetes. pt reports no issues with this condition since lastOV. Pt dosen't check his BS's at home. Pt reports taking his medication as prescribed with no known side effects.  . Medication Refill    on Prozac and liptior. PT reports these medications work well with no known side effects.    HPI Jackson Sellers presents for  Diabetes: With stage IIIa chronic kidney disease, hyperglycemia Creatinine range 1.2-1.35 previously, Creatinine had increased to 1.5 in March, 2 to 3-week recheck recommended.1.67 on most recent blood work, with eGFR of 40. Treated with Metformin 500 mg twice daily.  He is on ACE inhibitor with lisinopril, Lipitor for statin.  3-week follow-up discussed in March for adjusting meds.  First visit back today. Microalbumin: Ordered in March, has not been checked since February 2020, normal ratio at that time. Unable tp provide urine today Optho, foot exam, pneumovax: Up-to-date No home readings No new side effects with metformin.  No NSAIDS.  No regular exercise - mows yard. Knee pains limiting.  No fast food or sweetened beverages.  Has lost some weight.  Wt Readings from Last 3 Encounters:  12/14/19 254 lb (115.2 kg)  06/15/19 261 lb (118.4 kg)  12/22/18 269 lb (122 kg)    Lab Results  Component Value Date   HGBA1C 7.1 (A) 12/14/2019   HGBA1C 7.5 (H) 06/15/2019   HGBA1C 7.4 (H) 12/22/2018   Lab Results  Component Value Date   MICROALBUR 0.3 04/10/2015   LDLCALC 105 (H) 06/15/2019   CREATININE 1.67 (H) 12/10/2019   Hypertension: Lisinopril/HCTZ 20/25 mg daily.  Home readings:none.  BP Readings from Last 3 Encounters:  12/14/19 140/80  06/15/19 134/80  12/22/18 112/69   Lab Results  Component Value Date   CREATININE 1.67 (H) 12/10/2019   Hyperlipidemia: Lipitor 10 mg daily, aspirin  81 mg over-the-counter daily.  No new myalgias/side effects of meds. Lab Results  Component Value Date   CHOL 206 (H) 06/15/2019   HDL 39 (L) 06/15/2019   LDLCALC 105 (H) 06/15/2019   TRIG 366 (H) 06/15/2019   CHOLHDL 5.3 (H) 06/15/2019   Lab Results  Component Value Date   ALT 12 06/15/2019   AST 16 06/15/2019   ALKPHOS 66 06/15/2019   BILITOT 0.5 06/15/2019   Hypothyroidism: Lab Results  Component Value Date   TSH 1.240 06/15/2019  Taking medication daily.  Synthroid 137 mcg No new hot or cold intolerance. No new hair or skin changes, heart palpitations or new fatigue. No new weight changes.   Anxiety/generalized anxiety disorder Managed with fluoxetine 20 mg daily, symptoms well controlled on current dosing.  No new side effects.  Depression screen [facility] 2/9 12/14/2019 07/12/2019 06/15/2019 12/22/2018 05/12/2018  Decreased Interest 0 0 0 0 0  Down, Depressed, Hopeless 0 0 0 0 0  PHQ - 2 Score 0 0 0 0 0   GAD 7 : Generalized Anxiety Score 12/14/2019  Nervous, Anxious, on Edge 0  Control/stop worrying 0  Worry too much - different things 0  Trouble relaxing 0  Restless 0  Easily annoyed or irritable 0  Afraid - awful might happen 0  Total GAD 7 Score 0       History Patient Active Problem List   Diagnosis Date Noted  . Vertigo 04/12/2012  .  Diabetes ([place]) 02/22/2012  . Hypertension 02/22/2012  . GERD (gastroesophageal reflux disease) 02/22/2012  . Hyperlipidemia 02/22/2012  . Hypothyroid 02/22/2012  . Arthritis 02/22/2012   Past Medical History:  Diagnosis Date  . Arthritis   . Cataract   . Diabetes mellitus without complication ([place])   . GERD (gastroesophageal reflux disease)   . Hypertension   . Thyroid disease    Past Surgical History:  Procedure Laterality Date  . EYE SURGERY    . FOOT SURGERY    . HERNIA REPAIR    . TONSILLECTOMY     Allergies  Allergen Reactions  . Metoclopramide Hcl Other (See Comments)    Pt states that he" felt like  something was crawling all over me"  . Erythrocin Rash  . Penicillins Rash    Has patient had a PCN reaction causing immediate rash, facial/tongue/throat swelling, SOB or lightheadedness with hypotension: No Has patient had a PCN reaction causing severe rash involving mucus membranes or skin necrosis: No Has patient had a PCN reaction that required hospitalization No Has patient had a PCN reaction occurring within the last 10 years:No If all of the above answers are "NO", then may proceed with Cephalosporin use.    Prior to Admission medications   Medication Sig Start Date End Date Taking? Authorizing Provider  aspirin 81 MG tablet Take 1 tablet (81 mg total) by mouth daily. 05/26/11  Yes Darlyne Russian, MD  atorvastatin (LIPITOR) 10 MG tablet Take 1 tablet (10 mg total) by mouth daily. 06/15/19  Yes Wendie Agreste, MD  cholecalciferol (VITAMIN D) 1000 UNITS tablet Take 1 tablet (1,000 Units total) by mouth 2 (two) times daily. 05/26/11  Yes Darlyne Russian, MD  FLUoxetine (PROZAC) 20 MG capsule TAKE 1 CAPSULE BY MOUTH EVERY DAY 06/15/19  Yes Wendie Agreste, MD  levothyroxine (SYNTHROID) 137 MCG tablet TAKE 1 TABLET (137 MCG TOTAL) BY MOUTH DAILY BEFORE BREAKFAST. 12/05/19  Yes Wendie Agreste, MD  lisinopril-hydrochlorothiazide (ZESTORETIC) 20-25 MG tablet TAKE 1 TABLET BY MOUTH EVERY DAY 12/05/19  Yes Wendie Agreste, MD  metFORMIN (GLUCOPHAGE) 500 MG tablet TAKE 1 TABLET BY MOUTH TWICE A DAY WITH A MEAL 12/05/19  Yes Wendie Agreste, MD   Social History   Socioeconomic History  . Marital status: Married    Spouse name: Not on file  . Number of children: Not on file  . Years of education: Not on file  . Highest education level: Not on file  Occupational History  . Occupation: Retired  Tobacco Use  . Smoking status: Never Smoker  . Smokeless tobacco: Never Used  Vaping Use  . Vaping Use: Never used  Substance and Sexual Activity  . Alcohol use: No  . Drug use: No  . Sexual  activity: Yes  Other Topics Concern  . Not on file  Social History Narrative   Married   Education: Secretary/administrator   Exercise: Yes   Social Determinants of Health   Financial Resource Strain:   . Difficulty of Paying Living Expenses: Not on file  Food Insecurity: No Food Insecurity  . Worried About Charity fundraiser in the Last Year: Never true  . Ran Out of Food in the Last Year: Never true  Transportation Needs: No Transportation Needs  . Lack of Transportation (Medical): No  . Lack of Transportation (Non-Medical): No  Physical Activity:   . Days of Exercise per Week: Not on file  . Minutes of Exercise per Session: Not on  file  Stress:   . Feeling of Stress : Not on file  Social Connections:   . Frequency of Communication with Friends and Family: Not on file  . Frequency of Social Gatherings with Friends and Family: Not on file  . Attends Religious Services: Not on file  . Active Member of Clubs or Organizations: Not on file  . Attends Archivist Meetings: Not on file  . Marital Status: Not on file  Intimate Partner Violence:   . Fear of Current or Ex-Partner: Not on file  . Emotionally Abused: Not on file  . Physically Abused: Not on file  . Sexually Abused: Not on file    Review of Systems  Constitutional: Negative for fatigue and unexpected weight change.  Eyes: Negative for visual disturbance.  Respiratory: Negative for cough, chest tightness and shortness of breath.   Cardiovascular: Negative for chest pain, palpitations and leg swelling.  Gastrointestinal: Negative for abdominal pain and blood in stool.  Genitourinary: Negative for decreased urine volume and difficulty urinating.  Neurological: Negative for dizziness, light-headedness and headaches.     Objective:   Vitals:   12/14/19 0952 12/14/19 0955  BP: (!) 142/87 140/80  Pulse: 90   Temp: (!) 97.4 F (36.3 C)   TempSrc: Temporal   SpO2: 97%   Weight: 254 lb (115.2 kg)   Height: _0   (1.753 m)      Physical Exam Vitals reviewed.  Constitutional:      Appearance: He is well-developed. He is obese.  HENT:     Head: Normocephalic and atraumatic.  Eyes:     Pupils: Pupils are equal, round, and reactive to light.  Neck:     Vascular: No carotid bruit or JVD.     Comments: No mass/nodule appreciated.  Cardiovascular:     Rate and Rhythm: Normal rate and regular rhythm.     Heart sounds: Normal heart sounds. No murmur heard.   Pulmonary:     Effort: Pulmonary effort is normal.     Breath sounds: Normal breath sounds. No rales.  Musculoskeletal:     Cervical back: Neck supple.  Skin:    General: Skin is warm and dry.  Neurological:     Mental Status: He is alert and oriented to person, place, and time.     Results for orders placed or performed in visit on 12/14/19  POCT glucose (manual entry)  Result Value Ref Range   POC Glucose 110 (A) 70 - 99 mg/dl  POCT glycosylated hemoglobin (Hb A1C)  Result Value Ref Range   Hemoglobin A1C 7.1 (A) 4.0 - 5.6 %   HbA1c POC (<> result, manual entry)     HbA1c, POC (prediabetic range)     HbA1c, POC (controlled diabetic range)      34 minutes spent during visit, greater than 50% counseling and assimilation of information, chart review, and discussion of plan.    Assessment & Plan:  Jackson Sellers is a 74 y.o. male . Type 2 diabetes mellitus with stage 3b chronic kidney disease, without long-term current use of insulin ([place]) - Plan: Comprehensive metabolic panel, POCT glucose (manual entry), POCT glycosylated hemoglobin (Hb A1C), Ambulatory referral to Nephrology, Microalbumin / creatinine urine ratio  -A1c improved.  We will continue Metformin for now but will need to continue to monitor renal function.  Nephrology referral placed with elevated creatinine, repeat labs in the next 1 month  Hyperlipidemia, unspecified hyperlipidemia type - Plan: atorvastatin (LIPITOR) 10 MG tablet, Lipid  panel  - Stable, tolerating  current regimen. Medications refilled. Labs pending as above.   Anxiety state - Plan: FLUoxetine (PROZAC) 20 MG capsule  -Stable, continue fluoxetine same dose.  Need for prophylactic vaccination and inoculation against influenza - Plan: Flu Vaccine QUAD High Dose(Fluad)  Hypothyroidism, unspecified type - Plan: TSH  -Check TSH, no med changes  Essential hypertension  -Stable, continue same regimen  Meds ordered this encounter  Medications  . atorvastatin (LIPITOR) 10 MG tablet    Sig: Take 1 tablet (10 mg total) by mouth daily.    Dispense:  90 tablet    Refill:  1  . FLUoxetine (PROZAC) 20 MG capsule    Sig: TAKE 1 CAPSULE BY MOUTH EVERY DAY    Dispense:  90 capsule    Refill:  1   Patient Instructions    Diabetes control has improved.  Continue to watch diet, make sure to drink plenty of fluids, water is best.  Continue to avoid Advil or Aleve, or other NSAIDs.  Kidney function has increased this past year, so I will refer you to nephrology, and we need to keep a close eye on that level to make sure it still okay to use Metformin.  No medication changes for now.  Recheck with me in the next 1 month for repeat testing.  If any nausea, vomiting, or other new symptoms be seen right away.  No other medication changes at this time.  If you have lab work done today you will be contacted with your lab results within the next 2 weeks.  If you have not heard from [place] then please contact us. The fastest way to get your results is to register for My Chart.   IF you received an x-ray today, you will receive an invoice from [facility]. Please contact [facility] at [phone] with questions or concerns regarding your invoice.   IF you received labwork today, you will receive an invoice from [facility]. Please contact [facility] at [phone] with questions or concerns regarding your invoice.   Our billing staff will not be able to assist you with questions regarding  bills from these companies.  You will be contacted with the lab results as soon as they are available. The fastest way to get your results is to activate your My Chart account. Instructions are located on the last page of this paperwork. If you have not heard from [place] regarding the results in 2 weeks, please contact this office.         Signed, Merri Ray, MD [facility]

## 2019-12-14 NOTE — Patient Instructions (Addendum)
  Diabetes control has improved.  Continue to watch diet, make sure to drink plenty of fluids, water is best.  Continue to avoid Advil or Aleve, or other NSAIDs.  Kidney function has increased this past year, so I will refer you to nephrology, and we need to keep a close eye on that level to make sure it still okay to use Metformin.  No medication changes for now.  Recheck with me in the next 1 month for repeat testing.  If any nausea, vomiting, or other new symptoms be seen right away.  No other medication changes at this time.  If you have lab work done today you will be contacted with your lab results within the next 2 weeks.  If you have not heard from [place] then please contact us. The fastest way to get your results is to register for My Chart.   IF you received an x-ray today, you will receive an invoice from [facility]. Please contact [facility] Radiology at [phone] with questions or concerns regarding your invoice.   IF you received labwork today, you will receive an invoice from [place]. Please contact [facility] at [phone] with questions or concerns regarding your invoice.   Our billing staff will not be able to assist you with questions regarding bills from these companies.  You will be contacted with the lab results as soon as they are available. The fastest way to get your results is to activate your My Chart account. Instructions are located on the last page of this paperwork. If you have not heard from [place] regarding the results in 2 weeks, please contact this office.

## 2019-12-15 ENCOUNTER — Encounter: Payer: Self-pay | Admitting: Family Medicine

## 2019-12-15 LAB — COMPREHENSIVE METABOLIC PANEL
ALT: 8 IU/L (ref 0–44)
AST: 12 IU/L (ref 0–40)
Albumin/Globulin Ratio: 2 (ref 1.2–2.2)
Albumin: 4.5 g/dL (ref 3.7–4.7)
Alkaline Phosphatase: 61 IU/L (ref 44–121)
BUN/Creatinine Ratio: 11 (ref 10–24)
BUN: 16 mg/dL (ref 8–27)
Bilirubin Total: 0.5 mg/dL (ref 0.0–1.2)
CO2: 18 mmol/L — ABNORMAL LOW (ref 20–29)
Calcium: 9.6 mg/dL (ref 8.6–10.2)
Chloride: 101 mmol/L (ref 96–106)
Creatinine, Ser: 1.5 mg/dL — ABNORMAL HIGH (ref 0.76–1.27)
GFR calc Af Amer: 52 mL/min/{1.73_m2} — ABNORMAL LOW (ref >59)
GFR calc non Af Amer: 45 mL/min/{1.73_m2} — ABNORMAL LOW (ref >59)
Globulin, Total: 2.2 g/dL (ref 1.5–4.5)
Glucose: 118 mg/dL — ABNORMAL HIGH (ref 65–99)
Potassium: 4.8 mmol/L (ref 3.5–5.2)
Sodium: 137 mmol/L (ref 134–144)
Total Protein: 6.7 g/dL (ref 6.0–8.5)

## 2019-12-15 LAB — LIPID PANEL
Chol/HDL Ratio: 5.3 ratio — ABNORMAL HIGH (ref 0.0–5.0)
Cholesterol, Total: 189 mg/dL (ref 100–199)
HDL: 36 mg/dL — ABNORMAL LOW (ref >39)
LDL Chol Calc (NIH): 102 mg/dL — ABNORMAL HIGH (ref 0–99)
Triglycerides: 299 mg/dL — ABNORMAL HIGH (ref 0–149)
VLDL Cholesterol Cal: 51 mg/dL — ABNORMAL HIGH (ref 5–40)

## 2019-12-15 LAB — TSH: TSH: 0.414 u[IU]/mL — ABNORMAL LOW (ref 0.450–4.500)

## 2020-01-12 ENCOUNTER — Ambulatory Visit: Payer: HMO | Attending: Internal Medicine

## 2020-01-12 ENCOUNTER — Other Ambulatory Visit: Payer: Self-pay

## 2020-01-12 DIAGNOSIS — Z23 Encounter for immunization: Secondary | ICD-10-CM

## 2020-01-12 NOTE — Progress Notes (Signed)
   Covid-19 Vaccination Clinic  Name:  Jackson Sellers    MRN: [id number] DOB: 1945/06/06  01/12/2020  Mr. Jackson Sellers was observed post Covid-19 immunization for 15 minutes without incident. He was provided with Vaccine Information Sheet and instruction to access the V-Safe system.   Mr. Jackson Sellers was instructed to call 911 with any severe reactions post vaccine: Marland Kitchen Difficulty breathing  . Swelling of face and throat  . A fast heartbeat  . A bad rash all over body  . Dizziness and weakness

## 2020-01-14 ENCOUNTER — Ambulatory Visit (INDEPENDENT_AMBULATORY_CARE_PROVIDER_SITE_OTHER): Payer: HMO | Admitting: Family Medicine

## 2020-01-14 ENCOUNTER — Other Ambulatory Visit: Payer: Self-pay

## 2020-01-14 ENCOUNTER — Encounter: Payer: Self-pay | Admitting: Family Medicine

## 2020-01-14 VITALS — BP 126/70 | HR 82 | Temp 98.4°F | Ht 69.0 in | Wt 247.0 lb

## 2020-01-14 DIAGNOSIS — E039 Hypothyroidism, unspecified: Secondary | ICD-10-CM

## 2020-01-14 DIAGNOSIS — N1831 Chronic kidney disease, stage 3a: Secondary | ICD-10-CM | POA: Diagnosis not present

## 2020-01-14 DIAGNOSIS — E1122 Type 2 diabetes mellitus with diabetic chronic kidney disease: Secondary | ICD-10-CM

## 2020-01-14 NOTE — Patient Instructions (Addendum)
° °  Follow up with nephrology as planned. Keep up the good work with weight loss!  If you have lab work done today you will be contacted with your lab results within the next 2 weeks.  If you have not heard from [place] then please contact us. The fastest way to get your results is to register for My Chart.   IF you received an x-ray today, you will receive an invoice from [facility]. Please contact [facility]iology at [phone] with questions or concerns regarding your invoice.   IF you received labwork today, you will receive an invoice from [place]. Please contact [facility] at [phone] with questions or concerns regarding your invoice.   Our billing staff will not be able to assist you with questions regarding bills from these companies.  You will be contacted with the lab results as soon as they are available. The fastest way to get your results is to activate your My Chart account. Instructions are located on the last page of this paperwork. If you have not heard from [place] regarding the results in 2 weeks, please contact this office.

## 2020-01-14 NOTE — Progress Notes (Signed)
Subjective:  Patient ID: Jackson Sellers, male    DOB: 08/14/1945  Age: 74 y.o. MRN: [id number]  CC:  Chief Complaint  Patient presents with  . Follow-up    on Recheck renal function, hypertension. Pr reports no issues with either of these conditions. pt reports no physical symptoms of these conditions.    HPI KAZI REPPOND presents for   CKD, stage 3b,  Lab Results  Component Value Date   CREATININE 1.50 (H) 12/14/2019   BUN 16 12/14/2019   NA 137 12/14/2019   K 4.8 12/14/2019   CL 101 12/14/2019   CO2 18 (L) 12/14/2019  History of hypertension, hyperlipidemia, diabetes.  Last visit in August.  Nephrology referral placed, close follow-up renal function discussed given the use of Metformin BID  Last EGFR 45 on September 17.  Creatinine had improved from 1.67 on September 13. Nephrology eval in 1 week.  No nsaids. Drinking fluids. Watching diet for weight loss.  Urinating normally.  Wt Readings from Last 3 Encounters:  01/14/20 247 lb (112 kg)  12/14/19 254 lb (115.2 kg)  06/15/19 261 lb (118.4 kg)     Hypothyroidism: Lab Results  Component Value Date   TSH 0.414 (L) 12/14/2019  Borderline TSH at September 17 visit.  Continue same regimen, repeat planned today. On 160mg QD.    History Patient Active Problem List   Diagnosis Date Noted  . Vertigo 04/12/2012  . Diabetes ([place] 02/22/2012  . Hypertension 02/22/2012  . GERD (gastroesophageal reflux disease) 02/22/2012  . Hyperlipidemia 02/22/2012  . Hypothyroid 02/22/2012  . Arthritis 02/22/2012   Past Medical History:  Diagnosis Date  . Arthritis   . Cataract   . [place]   . GERD (gastroesophageal reflux disease)   . Hypertension   . Thyroid disease    Past Surgical History:  Procedure Laterality Date  . EYE SURGERY    . FOOT SURGERY    . HERNIA REPAIR    . TONSILLECTOMY     Allergies  Allergen Reactions  . Metoclopramide Hcl Other (See Comments)    Pt  states that he" felt like something was crawling all over me"  . Erythrocin Rash  . Penicillins Rash    Has patient had a PCN reaction causing immediate rash, facial/tongue/throat swelling, SOB or lightheadedness with hypotension: No Has patient had a PCN reaction causing severe rash involving mucus membranes or skin necrosis: No Has patient had a PCN reaction that required hospitalization No Has patient had a PCN reaction occurring within the last 10 years:No If all of the above answers are "NO", then may proceed with Cephalosporin use.    Prior to Admission medications   Medication Sig Start Date End Date Taking? Authorizing Provider  aspirin 81 MG tablet Take 1 tablet (81 mg total) by mouth daily. 05/26/11  Yes DDarlyne Russian MD  atorvastatin (LIPITOR) 10 MG tablet Take 1 tablet (10 mg total) by mouth daily. 12/14/19  Yes GWendie Agreste MD  cholecalciferol (VITAMIN D) 1000 UNITS tablet Take 1 tablet (1,000 Units total) by mouth 2 (two) times daily. 05/26/11  Yes DDarlyne Russian MD  FLUoxetine (PROZAC) 20 MG capsule TAKE 1 CAPSULE BY MOUTH EVERY DAY 12/14/19  Yes GWendie Agreste MD  levothyroxine (SYNTHROID) 137 MCG tablet TAKE 1 TABLET (137 MCG TOTAL) BY MOUTH DAILY BEFORE BREAKFAST. 12/05/19  Yes GWendie Agreste MD  lisinopril-hydrochlorothiazide (ZESTORETIC) 20-25 MG tablet TAKE 1 TABLET BY MOUTH EVERY DAY 12/05/19  Yes Wendie Agreste, MD  metFORMIN (GLUCOPHAGE) 500 MG tablet TAKE 1 TABLET BY MOUTH TWICE A DAY WITH A MEAL 12/05/19  Yes Wendie Agreste, MD   Social History   Socioeconomic History  . Marital status: Married    Spouse name: Not on file  . Number of children: Not on file  . Years of education: Not on file  . Highest education level: Not on file  Occupational History  . Occupation: Retired  Tobacco Use  . Smoking status: Never Smoker  . Smokeless tobacco: Never Used  Vaping Use  . Vaping Use: Never used  Substance and Sexual Activity  . Alcohol use: No  .  Drug use: No  . Sexual activity: Yes  Other Topics Concern  . Not on file  Social History Narrative   Married   Education: Secretary/administrator   Exercise: Yes   Social Determinants of Health   Financial Resource Strain:   . Difficulty of Paying Living Expenses: Not on file  Food Insecurity: No Food Insecurity  . Worried About Charity fundraiser in the Last Year: Never true  . Ran Out of Food in the Last Year: Never true  Transportation Needs: No Transportation Needs  . Lack of Transportation (Medical): No  . Lack of Transportation (Non-Medical): No  Physical Activity:   . Days of Exercise per Week: Not on file  . Minutes of Exercise per Session: Not on file  Stress:   . Feeling of Stress : Not on file  Social Connections:   . Frequency of Communication with Friends and Family: Not on file  . Frequency of Social Gatherings with Friends and Family: Not on file  . Attends Religious Services: Not on file  . Active Member of Clubs or Organizations: Not on file  . Attends Archivist Meetings: Not on file  . Marital Status: Not on file  Intimate Partner Violence:   . Fear of Current or Ex-Partner: Not on file  . Emotionally Abused: Not on file  . Physically Abused: Not on file  . Sexually Abused: Not on file    Review of Systems Per HPI   Objective:   Vitals:   01/14/20 1140 01/14/20 1230  BP: (!) 143/79 126/70  Pulse: 82   Temp: 98.4 F (36.9 C)   TempSrc: Temporal   SpO2: 97%   Weight: 247 lb (112 kg)   Height: _0  (1.753 m)      Physical Exam Vitals reviewed.  Constitutional:      Appearance: He is well-developed.  HENT:     Head: Normocephalic and atraumatic.  Eyes:     Pupils: Pupils are equal, round, and reactive to light.  Neck:     Vascular: No carotid bruit or JVD.  Cardiovascular:     Rate and Rhythm: Normal rate and regular rhythm.     Heart sounds: Normal heart sounds. No murmur heard.   Pulmonary:     Effort: Pulmonary effort is normal.      Breath sounds: Normal breath sounds. No rales.  Skin:    General: Skin is warm and dry.  Neurological:     Mental Status: He is alert and oriented to person, place, and time.        Assessment & Plan:  JATAVIAN CALICA is a 74 y.o. male . Type 2 diabetes mellitus with stage 3a chronic kidney disease, without long-term current use of insulin ([place]) - Plan: Microalbumin / creatinine urine ratio, Basic metabolic  panel  - follow up with nephrology as planned. Repeat BMP, monitor closely with use metformin.   Hypothyroidism, unspecified type - Plan: TSH + free T4  - borderline tsh prior. Repeat labs.   No orders of the defined types were placed in this encounter.  Patient Instructions     Follow up with nephrology as planned. Keep up the good work with weight loss!  If you have lab work done today you will be contacted with your lab results within the next 2 weeks.  If you have not heard from [place] then please contact us. The fastest way to get your results is to register for My Chart.   IF you received an x-ray today, you will receive an invoice from [facility]. Please contact [facility] Radiology at [phone] with questions or concerns regarding your invoice.   IF you received labwork today, you will receive an invoice from [place]. Please contact [facility] at [phone] with questions or concerns regarding your invoice.   Our billing staff will not be able to assist you with questions regarding bills from these companies.  You will be contacted with the lab results as soon as they are available. The fastest way to get your results is to activate your My Chart account. Instructions are located on the last page of this paperwork. If you have not heard from [place] regarding the results in 2 weeks, please contact this office.         Signed, Merri Ray, MD [facility]

## 2020-01-15 LAB — BASIC METABOLIC PANEL
BUN/Creatinine Ratio: 13 (ref 10–24)
BUN: 19 mg/dL (ref 8–27)
CO2: 20 mmol/L (ref 20–29)
Calcium: 9.3 mg/dL (ref 8.6–10.2)
Chloride: 105 mmol/L (ref 96–106)
Creatinine, Ser: 1.5 mg/dL — ABNORMAL HIGH (ref 0.76–1.27)
GFR calc Af Amer: 52 mL/min/{1.73_m2} — ABNORMAL LOW (ref >59)
GFR calc non Af Amer: 45 mL/min/{1.73_m2} — ABNORMAL LOW (ref >59)
Glucose: 105 mg/dL — ABNORMAL HIGH (ref 65–99)
Potassium: 5.1 mmol/L (ref 3.5–5.2)
Sodium: 137 mmol/L (ref 134–144)

## 2020-01-15 LAB — TSH+FREE T4
Free T4: 1.42 ng/dL (ref 0.82–1.77)
TSH: 0.306 u[IU]/mL — ABNORMAL LOW (ref 0.450–4.500)

## 2020-01-15 LAB — MICROALBUMIN / CREATININE URINE RATIO
Creatinine, Urine: 59.3 mg/dL
Microalb/Creat Ratio: 6 mg/g creat (ref 0–29)
Microalbumin, Urine: 3.5 ug/mL

## 2020-01-21 DIAGNOSIS — E1122 Type 2 diabetes mellitus with diabetic chronic kidney disease: Secondary | ICD-10-CM | POA: Diagnosis not present

## 2020-01-21 DIAGNOSIS — K219 Gastro-esophageal reflux disease without esophagitis: Secondary | ICD-10-CM | POA: Diagnosis not present

## 2020-01-21 DIAGNOSIS — I129 Hypertensive chronic kidney disease with stage 1 through stage 4 chronic kidney disease, or unspecified chronic kidney disease: Secondary | ICD-10-CM | POA: Diagnosis not present

## 2020-01-21 DIAGNOSIS — N1832 Chronic kidney disease, stage 3b: Secondary | ICD-10-CM | POA: Diagnosis not present

## 2020-01-21 DIAGNOSIS — E785 Hyperlipidemia, unspecified: Secondary | ICD-10-CM | POA: Diagnosis not present

## 2020-01-21 DIAGNOSIS — E039 Hypothyroidism, unspecified: Secondary | ICD-10-CM | POA: Diagnosis not present

## 2020-01-22 ENCOUNTER — Other Ambulatory Visit: Payer: Self-pay | Admitting: Internal Medicine

## 2020-01-22 ENCOUNTER — Other Ambulatory Visit: Payer: Self-pay | Admitting: Family Medicine

## 2020-01-22 DIAGNOSIS — I129 Hypertensive chronic kidney disease with stage 1 through stage 4 chronic kidney disease, or unspecified chronic kidney disease: Secondary | ICD-10-CM

## 2020-01-22 DIAGNOSIS — N1832 Chronic kidney disease, stage 3b: Secondary | ICD-10-CM

## 2020-01-22 MED ORDER — LEVOTHYROXINE SODIUM 125 MCG PO TABS: 125.0000 ug | ORAL_TABLET | Freq: Every day | ORAL | 1 refills | Status: DC

## 2020-01-22 NOTE — Progress Notes (Signed)
See labs 

## 2020-01-24 ENCOUNTER — Other Ambulatory Visit: Payer: Self-pay

## 2020-01-24 NOTE — Patient Outreach (Addendum)
  [facility]) Care Management Chronic Special Needs Program    01/24/2020  Name: [name], DOB: February 28, 1946  MRN: [id number]   Mr. Jackson Sellers is enrolled in a chronic special needs plan.  Member will be followed by the [facility] [employer] case management team beginning March 29, 2020.  Case is closed by [facility] Care management, will reopen if needed before March 29, 2020.   Addendum: [facility] RNCM will continue to follow client through 03/28/20.  Client will be followed by the [facility] [employer] case management team beginning March 29, 2020.   George Ina RN,BSN,CCM Chronic Care Management Coordinator [facility] Care Management [phone]

## 2020-01-25 NOTE — Telephone Encounter (Signed)
This encounter was created in error - please disregard.

## 2020-01-29 ENCOUNTER — Ambulatory Visit
Admission: RE | Admit: 2020-01-29 | Discharge: 2020-01-29 | Disposition: A | Discharge status: Home / Self Care | Payer: HMO | Source: Ambulatory Visit | Attending: Internal Medicine | Admitting: Internal Medicine

## 2020-01-29 DIAGNOSIS — N1832 Chronic kidney disease, stage 3b: Secondary | ICD-10-CM

## 2020-01-29 DIAGNOSIS — I129 Hypertensive chronic kidney disease with stage 1 through stage 4 chronic kidney disease, or unspecified chronic kidney disease: Secondary | ICD-10-CM

## 2020-02-04 DIAGNOSIS — N1832 Chronic kidney disease, stage 3b: Secondary | ICD-10-CM | POA: Diagnosis not present

## 2020-03-07 ENCOUNTER — Other Ambulatory Visit: Payer: Self-pay

## 2020-04-02 ENCOUNTER — Other Ambulatory Visit: Payer: Self-pay

## 2020-04-13 ENCOUNTER — Other Ambulatory Visit: Payer: Self-pay | Admitting: Family Medicine

## 2020-04-13 DIAGNOSIS — E785 Hyperlipidemia, unspecified: Secondary | ICD-10-CM

## 2020-04-13 DIAGNOSIS — F411 Generalized anxiety disorder: Secondary | ICD-10-CM

## 2020-04-13 DIAGNOSIS — E119 Type 2 diabetes mellitus without complications: Secondary | ICD-10-CM

## 2020-04-14 ENCOUNTER — Ambulatory Visit: Payer: HMO | Admitting: Family Medicine

## 2020-05-21 ENCOUNTER — Ambulatory Visit: Payer: HMO | Admitting: Family Medicine

## 2020-06-20 ENCOUNTER — Other Ambulatory Visit: Payer: Self-pay

## 2020-06-20 ENCOUNTER — Ambulatory Visit (INDEPENDENT_AMBULATORY_CARE_PROVIDER_SITE_OTHER): Payer: HMO | Admitting: Family Medicine

## 2020-06-20 ENCOUNTER — Encounter: Payer: Self-pay | Admitting: Family Medicine

## 2020-06-20 VITALS — BP 147/83 | HR 83 | Temp 97.5°F | Ht 69.0 in | Wt 259.0 lb

## 2020-06-20 DIAGNOSIS — E1122 Type 2 diabetes mellitus with diabetic chronic kidney disease: Secondary | ICD-10-CM

## 2020-06-20 DIAGNOSIS — E785 Hyperlipidemia, unspecified: Secondary | ICD-10-CM | POA: Diagnosis not present

## 2020-06-20 DIAGNOSIS — E039 Hypothyroidism, unspecified: Secondary | ICD-10-CM | POA: Diagnosis not present

## 2020-06-20 DIAGNOSIS — F411 Generalized anxiety disorder: Secondary | ICD-10-CM

## 2020-06-20 DIAGNOSIS — I10 Essential (primary) hypertension: Secondary | ICD-10-CM | POA: Diagnosis not present

## 2020-06-20 DIAGNOSIS — N1831 Chronic kidney disease, stage 3a: Secondary | ICD-10-CM | POA: Diagnosis not present

## 2020-06-20 MED ORDER — LEVOTHYROXINE SODIUM 125 MCG PO TABS: 125.0000 ug | ORAL_TABLET | Freq: Every day | ORAL | 1 refills | Status: DC

## 2020-06-20 MED ORDER — METFORMIN HCL 500 MG PO TABS: ORAL_TABLET | ORAL | 1 refills | Status: DC

## 2020-06-20 MED ORDER — FLUOXETINE HCL 20 MG PO CAPS: 20.0000 mg | ORAL_CAPSULE | Freq: Every day | ORAL | 1 refills | Status: DC

## 2020-06-20 MED ORDER — AMLODIPINE BESYLATE 2.5 MG PO TABS: 2.5000 mg | ORAL_TABLET | Freq: Every day | ORAL | 1 refills | Status: DC

## 2020-06-20 MED ORDER — ATORVASTATIN CALCIUM 10 MG PO TABS: 10.0000 mg | ORAL_TABLET | Freq: Every day | ORAL | 1 refills | Status: DC

## 2020-06-20 NOTE — Progress Notes (Signed)
Subjective:  Patient ID: Jackson Sellers, male    DOB: [birth year]  Age: 75 y.o. MRN: [id number]  CC:  Chief Complaint  Patient presents with  . Follow-up    On diabetes. Pt reports no issues that he has noticed with his BS since last OV. Pt reports som inactivity recently due to staying home to take care of his wife.  . Medication Refill    Pt would ike refills on pended medication. Pt reports he runs out before the provider starts at the new practice. Pt reports no issues with current medication nor any side effects. Pt reports being unsure if he taking 125MCG's or 137MCG's of his synthroid.     HPI Jackson Sellers presents for   Diabetes: With CKD.  Taking metformin 500mg BID.  On statin.  Home readings - none.  No new side effects with meds.  Followed by nephrology -  Adjusted meds last visit - has appt in next month.   Microalbumin: normal 01/14/20 Optho, foot exam, pneumovax:  Diabetic Foot Exam - Simple   Simple Foot Form Diabetic Foot exam was performed with the following findings: Yes 06/20/2020  3:12 PM  Visual Inspection See comments: Yes Sensation Testing Intact to touch and monofilament testing bilaterally: Yes Pulse Check Posterior Tibialis and Dorsalis pulse intact bilaterally: Yes Comments Pt has some thickening of the skin on both feet and thickening of his toe nails. I also notice some moister inbetween the pt's toes on both feet, but no signs of sores. All other finding are normal.    Lab Results  Component Value Date   HGBA1C 7.1 (A) 12/14/2019   HGBA1C 7.5 (H) 06/15/2019   HGBA1C 7.4 (H) 12/22/2018   Lab Results  Component Value Date   MICROALBUR 0.3 04/10/2015   LDLCALC 102 (H) 12/14/2019   CREATININE 1.50 (H) 01/14/2020   Hypertension: Meds were adjusted in October.  Change from lisinopril HCT combo to lisinopril 20 mg daily, chlorthalidone 25 mg daily, option of titration of lisinopril to 40 mg or addition of amlodipine. Appt with nephrology  in 1 month.  Home readings: 140/80-83 BP Readings from Last 3 Encounters:  06/20/20 (!) 147/83  01/14/20 126/70  12/14/19 140/80   Lab Results  Component Value Date   CREATININE 1.50 (H) 01/14/2020    Hypothyroidism: Lab Results  Component Value Date   TSH 0.306 (L) 01/14/2020  decreased synthroid to 125mcg qd. Not sure of dose.  Taking medication daily.  No new hot or cold intolerance. No new hair or skin changes, heart palpitations or new fatigue. No new weight changes.   Hyperlipidemia: lipitor 10mg  qd. No new side effects.  Lab Results  Component Value Date   CHOL 189 12/14/2019   HDL 36 (L) 12/14/2019   LDLCALC 102 (H) 12/14/2019   TRIG 299 (H) 12/14/2019   CHOLHDL 5.3 (H) 12/14/2019   Lab Results  Component Value Date   ALT 8 12/14/2019   AST 12 12/14/2019   ALKPHOS 61 12/14/2019   BILITOT 0.5 12/14/2019   Anxiety: Controlled on prozac. Same dose for some time.  [facility] 01/14/2020 12/14/2019 07/12/2019 06/15/2019 12/22/2018  Decreased Interest 0 0 0 0 0  Down, Depressed, Hopeless 0 0 0 0 0  PHQ - 2 Score 0 0 0 0 0    History Patient Active Problem List   Diagnosis Date Noted  . Vertigo 04/12/2012  . Diabetes (HCC) 02/22/2012  . Hypertension 02/22/2012  . GERD (gastroesophageal reflux disease)  02/22/2012  . Hyperlipidemia 02/22/2012  . Hypothyroid 02/22/2012  . Arthritis 02/22/2012   Past Medical History:  Diagnosis Date  . Arthritis   . Cataract   . Diabetes mellitus without complication (HCC)   . GERD (gastroesophageal reflux disease)   . Hypertension   . Thyroid disease    Past Surgical History:  Procedure Laterality Date  . EYE SURGERY    . FOOT SURGERY    . HERNIA REPAIR    . TONSILLECTOMY     Allergies  Allergen Reactions  . Metoclopramide Hcl Other (See Comments)    Pt states that he" felt like something was crawling all over me"  . Erythrocin Rash  . Penicillins Rash    Has patient had a PCN reaction causing  immediate rash, facial/tongue/throat swelling, SOB or lightheadedness with hypotension: No Has patient had a PCN reaction causing severe rash involving mucus membranes or skin necrosis: No Has patient had a PCN reaction that required hospitalization No Has patient had a PCN reaction occurring within the last 10 years:No If all of the above answers are "NO", then may proceed with Cephalosporin use.    Prior to Admission medications   Medication Sig Start Date End Date Taking? Authorizing Provider  aspirin 81 MG tablet Take 1 tablet (81 mg total) by mouth daily. 05/26/11  Yes Collene Gobble, MD  atorvastatin (LIPITOR) 10 MG tablet TAKE 1 TABLET BY MOUTH EVERY DAY 04/13/20  Yes Shade Flood, MD  cholecalciferol (VITAMIN D) 1000 UNITS tablet Take 1 tablet (1,000 Units total) by mouth 2 (two) times daily. 05/26/11  Yes Collene Gobble, MD  FLUoxetine (PROZAC) 20 MG capsule TAKE 1 CAPSULE BY MOUTH EVERY DAY 04/13/20  Yes Shade Flood, MD  hydrochlorothiazide (HYDRODIURIL) 25 MG tablet Take 25 mg by mouth daily.   Yes [provider]  levothyroxine (SYNTHROID) 125 MCG tablet Take 1 tablet (125 mcg total) by mouth daily. Patient taking differently: Take 137 mcg by mouth daily. 01/22/20  Yes Shade Flood, MD  lisinopril (ZESTRIL) 20 MG tablet Take 20 mg by mouth daily. 04/16/20  Yes [provider]  metFORMIN (GLUCOPHAGE) 500 MG tablet TAKE 1 TABLET BY MOUTH TWICE A DAY WITH A MEAL 04/13/20  Yes Shade Flood, MD  lisinopril-hydrochlorothiazide (ZESTORETIC) 20-25 MG tablet TAKE 1 TABLET BY MOUTH EVERY DAY Patient not taking: Reported on 06/20/2020 12/05/19   Shade Flood, MD   Social History   Socioeconomic History  . Marital status: Married    Spouse name: Not on file  . Number of children: Not on file  . Years of education: Not on file  . Highest education level: Not on file  Occupational History  . Occupation: Retired  Tobacco Use  . Smoking status: Never  Smoker  . Smokeless tobacco: Never Used  Vaping Use  . Vaping Use: Never used  Substance and Sexual Activity  . Alcohol use: No  . Drug use: No  . Sexual activity: Yes  Other Topics Concern  . Not on file  Social History Narrative   Married   Education: Automotive engineer   Exercise: Yes   Social Determinants of Health   Financial Resource Strain: Not on file  Food Insecurity: No Food Insecurity  . Worried About Programme researcher, broadcasting/film/video in the Last Year: Never true  . Ran Out of Food in the Last Year: Never true  Transportation Needs: No Transportation Needs  . Lack of Transportation (Medical): No  . Lack of  Transportation (Non-Medical): No  Physical Activity: Not on file  Stress: Not on file  Social Connections: Not on file  Intimate Partner Violence: Not on file    Review of Systems  Constitutional: Negative for fatigue and unexpected weight change.  Eyes: Negative for visual disturbance.  Respiratory: Negative for cough, chest tightness and shortness of breath.   Cardiovascular: Negative for chest pain, palpitations and leg swelling.  Gastrointestinal: Negative for abdominal pain and blood in stool.  Neurological: Negative for dizziness, light-headedness and headaches.     Objective:   Vitals:   06/20/20 1406  BP: (!) 147/83  Pulse: 83  Temp: (!) 97.5 F (36.4 C)  TempSrc: Temporal  SpO2: 97%  Weight: 259 lb (117.5 kg)  Height: 5\' 9"  (1.753 m)     Physical Exam Vitals reviewed.  Constitutional:      Appearance: He is well-developed.  HENT:     Head: Normocephalic and atraumatic.  Eyes:     Pupils: Pupils are equal, round, and reactive to light.  Neck:     Vascular: No carotid bruit or JVD.  Cardiovascular:     Rate and Rhythm: Normal rate and regular rhythm.     Heart sounds: Normal heart sounds. No murmur heard.   Pulmonary:     Effort: Pulmonary effort is normal.     Breath sounds: Normal breath sounds. No rales.  Abdominal:     Tenderness: There is no  abdominal tenderness.  Skin:    General: Skin is warm and dry.  Neurological:     Mental Status: He is alert and oriented to person, place, and time.  Psychiatric:        Mood and Affect: Mood normal.        Behavior: Behavior normal.      Assessment & Plan:  Jackson Sellers is a 75 y.o. male . Hypothyroidism, unspecified type - Plan: TSH  - Stable, tolerating current regimen. Medications refilled. Labs pending as above.   Hyperlipidemia, unspecified hyperlipidemia type - Plan: atorvastatin (LIPITOR) 10 MG tablet, Comprehensive metabolic panel, Lipid panel   - Stable, tolerating current regimen. Medications refilled. Labs pending as above.   Anxiety state - Plan: FLUoxetine (PROZAC) 20 MG capsule  - well controlled - continue same.   Type 2 diabetes mellitus with stage 3a chronic kidney disease, without long-term current use of insulin (HCC) - Plan: HM Diabetes Foot Exam, metFORMIN (GLUCOPHAGE) 500 MG tablet, Hemoglobin A1c  -Check labs.  Continue same dose Metformin.  Essential hypertension - Plan: Comprehensive metabolic panel  -Slight decreased control, notes reviewed from nephrology.  We will add low-dose amlodipine for now but follow-up with nephrology as planned to decide on further changes.  Meds ordered this encounter  Medications  . atorvastatin (LIPITOR) 10 MG tablet    Sig: Take 1 tablet (10 mg total) by mouth daily.    Dispense:  90 tablet    Refill:  1  . FLUoxetine (PROZAC) 20 MG capsule    Sig: Take 1 capsule (20 mg total) by mouth daily.    Dispense:  90 capsule    Refill:  1  . levothyroxine (SYNTHROID) 125 MCG tablet    Sig: Take 1 tablet (125 mcg total) by mouth daily before breakfast.    Dispense:  90 tablet    Refill:  1  . amLODipine (NORVASC) 2.5 MG tablet    Sig: Take 1 tablet (2.5 mg total) by mouth daily.    Dispense:  90 tablet  Refill:  1  . metFORMIN (GLUCOPHAGE) 500 MG tablet    Sig: TAKE 1 TABLET BY MOUTH TWICE A DAY WITH A MEAL     Dispense:  180 tablet    Refill:  1   Patient Instructions   My new practice: [facility] Address: [address], [place], [place] [address] Phone: [phone]  Here are a few lab drawing stations for you to have your labwork performed:  [facility] [address] [place], [place] [address]  [facility] [address] [place], [place] [address]  Add amlodipine once per day for blood pressure. Do not stop any of your other meds.   Recheck in 6 months, but lt me know if any questions sooner.   If you have lab work done today you will be contacted with your lab results within the next 2 weeks.  If you have not heard from [place] then please contact us. The fastest way to get your results is to register for My Chart.   IF you received an x-ray today, you will receive an invoice from [facility]. Please contact [facility] at [phone] with questions or concerns regarding your invoice.   IF you received labwork today, you will receive an invoice from [place]. Please contact [facility] at [phone] with questions or concerns regarding your invoice.   Our billing staff will not be able to assist you with questions regarding bills from these companies.  You will be contacted with the lab results as soon as they are available. The fastest way to get your results is to activate your My Chart account. Instructions are located on the last page of this paperwork. If you have not heard from [place] regarding the results in 2 weeks, please contact this office.         Signed, [name], MD [facility]

## 2020-06-20 NOTE — Patient Instructions (Addendum)
My new practice: [facility] Address: [address], [place], [place] [address] Phone: [phone]  Here are a few lab drawing stations for you to have your labwork performed:  [facility] [address] [place], [place] [address]  [facility] [address] [place], [place] [address]  Add amlodipine once per day for blood pressure. Do not stop any of your other meds.   Recheck in 6 months, but lt me know if any questions sooner.   If you have lab work done today you will be contacted with your lab results within the next 2 weeks.  If you have not heard from [place] then please contact us. The fastest way to get your results is to register for My Chart.   IF you received an x-ray today, you will receive an invoice from [facility]. Please contact [facility] at [phone] with questions or concerns regarding your invoice.   IF you received labwork today, you will receive an invoice from [facility]. Please contact [facility] at [phone] with questions or concerns regarding your invoice.   Our billing staff will not be able to assist you with questions regarding bills from these companies.  You will be contacted with the lab results as soon as they are available. The fastest way to get your results is to activate your My Chart account. Instructions are located on the last page of this paperwork. If you have not heard from [place] regarding the results in 2 weeks, please contact this office.

## 2020-06-21 ENCOUNTER — Encounter: Payer: Self-pay | Admitting: Family Medicine

## 2020-06-23 ENCOUNTER — Telehealth: Payer: Self-pay | Admitting: Family Medicine

## 2020-06-23 ENCOUNTER — Ambulatory Visit: Payer: HMO

## 2020-06-23 NOTE — Telephone Encounter (Signed)
Ok to reduce to every other day dosing. Keep close eye on BP - check twice daily and report findings. Should also watch intake/output to monitor for low volume and changes to sugar. If he has any swelling, headache, or other concerning symptoms seek in person eval.  Thanks,  Rich

## 2020-06-23 NOTE — Telephone Encounter (Signed)
Pt called in stating that he started taking the Amlodipine on Saturday. Since starting the medication he has been having a dual headache and slight dizziness. He wanted to know what he should do about this. Pt can be reached at the home #.   He states that he hasn't been taking his BP

## 2020-06-23 NOTE — Telephone Encounter (Signed)
Neva Seat pt  Pt reports low BP readings accompanied by headache and dizzy spell.  Pt started new Amlodipine on Saturday morning 2.5 mg once daily in the morning, pt notes had a slight headache first day and yesterday and today has had headache dizziness's, and general tiredness with BP of 107/72 about 1 hour after taking med then around noon started feeling better and bp improved to 137/77 and notes dizzy and headache were gone by this point.   Please advise. Told pt to hold until directed otherwise.

## 2020-06-24 DIAGNOSIS — E1122 Type 2 diabetes mellitus with diabetic chronic kidney disease: Secondary | ICD-10-CM | POA: Diagnosis not present

## 2020-06-24 DIAGNOSIS — N1831 Chronic kidney disease, stage 3a: Secondary | ICD-10-CM | POA: Diagnosis not present

## 2020-06-24 DIAGNOSIS — E039 Hypothyroidism, unspecified: Secondary | ICD-10-CM | POA: Diagnosis not present

## 2020-06-24 DIAGNOSIS — E785 Hyperlipidemia, unspecified: Secondary | ICD-10-CM | POA: Diagnosis not present

## 2020-06-24 DIAGNOSIS — I10 Essential (primary) hypertension: Secondary | ICD-10-CM | POA: Diagnosis not present

## 2020-06-24 NOTE — Telephone Encounter (Signed)
Pt advised and voiced understanding. I will check back on him next week.

## 2020-06-24 NOTE — Telephone Encounter (Signed)
LM for pt to return call to get updated directions for BP monitoring

## 2020-06-25 ENCOUNTER — Telehealth: Payer: Self-pay | Admitting: Family Medicine

## 2020-06-25 LAB — COMPREHENSIVE METABOLIC PANEL
ALT: 7 IU/L (ref 0–44)
AST: 15 IU/L (ref 0–40)
Albumin/Globulin Ratio: 2.2 (ref 1.2–2.2)
Albumin: 4.3 g/dL (ref 3.7–4.7)
Alkaline Phosphatase: 59 IU/L (ref 44–121)
BUN/Creatinine Ratio: 15 (ref 10–24)
BUN: 30 mg/dL — ABNORMAL HIGH (ref 8–27)
Bilirubin Total: 0.3 mg/dL (ref 0.0–1.2)
CO2: 16 mmol/L — ABNORMAL LOW (ref 20–29)
Calcium: 8.9 mg/dL (ref 8.6–10.2)
Chloride: 102 mmol/L (ref 96–106)
Creatinine, Ser: 1.99 mg/dL — ABNORMAL HIGH (ref 0.76–1.27)
Globulin, Total: 2 g/dL (ref 1.5–4.5)
Glucose: 122 mg/dL — ABNORMAL HIGH (ref 65–99)
Potassium: 5 mmol/L (ref 3.5–5.2)
Sodium: 139 mmol/L (ref 134–144)
Total Protein: 6.3 g/dL (ref 6.0–8.5)
eGFR: 34 mL/min/{1.73_m2} — ABNORMAL LOW (ref >59)

## 2020-06-25 LAB — HEMOGLOBIN A1C
Est. average glucose Bld gHb Est-mCnc: 163 mg/dL
Hgb A1c MFr Bld: 7.3 % — ABNORMAL HIGH (ref 4.8–5.6)

## 2020-06-25 LAB — LIPID PANEL
Chol/HDL Ratio: 6 ratio — ABNORMAL HIGH (ref 0.0–5.0)
Cholesterol, Total: 193 mg/dL (ref 100–199)
HDL: 32 mg/dL — ABNORMAL LOW (ref >39)
LDL Chol Calc (NIH): 73 mg/dL (ref 0–99)
Triglycerides: 564 mg/dL (ref 0–149)
VLDL Cholesterol Cal: 88 mg/dL — ABNORMAL HIGH (ref 5–40)

## 2020-06-25 LAB — TSH: TSH: 1.17 u[IU]/mL (ref 0.450–4.500)

## 2020-06-25 NOTE — Telephone Encounter (Signed)
Hold amlodipine for now. Keep a record of blood pressures outside of the office and if running over 150/90, can restart. Let me know readings over next week.

## 2020-06-25 NOTE — Telephone Encounter (Signed)
Called pt and let him know, informed him I would check back with him in about 1 week.

## 2020-07-02 ENCOUNTER — Ambulatory Visit: Payer: HMO | Admitting: Family Medicine

## 2020-07-03 ENCOUNTER — Telehealth: Payer: Self-pay

## 2020-07-03 NOTE — Telephone Encounter (Signed)
Called left a message to Jackson Sellers, wanted to discuss how he has done off BP med if he is still having Dizzy spells, or any high readings.  Pt was to track BP 1 to 2 times daily and BPS if he had any light headed/dizzy spells.

## 2020-07-03 NOTE — Telephone Encounter (Signed)
Also has lab work results to go over as well.

## 2020-07-04 NOTE — Telephone Encounter (Signed)
Called again today.

## 2020-07-07 NOTE — Telephone Encounter (Signed)
Called again, need to speak with pt about BP and lab work. LM for him to return call

## 2020-07-08 DIAGNOSIS — N1832 Chronic kidney disease, stage 3b: Secondary | ICD-10-CM | POA: Diagnosis not present

## 2020-07-10 NOTE — Telephone Encounter (Signed)
LMTCB

## 2020-07-14 ENCOUNTER — Telehealth: Payer: Self-pay

## 2020-07-14 NOTE — Telephone Encounter (Signed)
LM again will try Wife today

## 2020-07-14 NOTE — Telephone Encounter (Signed)
Patient returned Mackenzie's call and I let him know his results for his lab work.

## 2020-07-16 ENCOUNTER — Telehealth: Payer: Self-pay

## 2020-07-16 DIAGNOSIS — N1832 Chronic kidney disease, stage 3b: Secondary | ICD-10-CM | POA: Diagnosis not present

## 2020-07-16 DIAGNOSIS — E872 Acidosis: Secondary | ICD-10-CM | POA: Diagnosis not present

## 2020-07-16 DIAGNOSIS — E039 Hypothyroidism, unspecified: Secondary | ICD-10-CM | POA: Diagnosis not present

## 2020-07-16 DIAGNOSIS — K219 Gastro-esophageal reflux disease without esophagitis: Secondary | ICD-10-CM | POA: Diagnosis not present

## 2020-07-16 DIAGNOSIS — E1122 Type 2 diabetes mellitus with diabetic chronic kidney disease: Secondary | ICD-10-CM | POA: Diagnosis not present

## 2020-07-16 DIAGNOSIS — E875 Hyperkalemia: Secondary | ICD-10-CM | POA: Diagnosis not present

## 2020-07-16 DIAGNOSIS — I129 Hypertensive chronic kidney disease with stage 1 through stage 4 chronic kidney disease, or unspecified chronic kidney disease: Secondary | ICD-10-CM | POA: Diagnosis not present

## 2020-07-16 DIAGNOSIS — E785 Hyperlipidemia, unspecified: Secondary | ICD-10-CM | POA: Diagnosis not present

## 2020-07-16 NOTE — Telephone Encounter (Signed)
Documented phone call on existing thread this visit can be closed

## 2020-07-16 NOTE — Telephone Encounter (Signed)
Finally reached pt, since stopping amlodipine dizzy spells have stopped pt reports avg bp 120s/70 -80. Saw kidney specialist today and in office BP was 120/80. They also stopped Metformin due to function levels, and started new medication did inform him of lab work from March that had abnormalities and discussed changes. Pt states he feels well and will follow up with [place] in office in about 6 weeks offered to schedule pt declined stating he will call back to do so.

## 2020-07-18 NOTE — Telephone Encounter (Signed)
Noted.  Appreciate the update. ?

## 2020-08-07 DIAGNOSIS — N1832 Chronic kidney disease, stage 3b: Secondary | ICD-10-CM | POA: Diagnosis not present

## 2020-08-27 DIAGNOSIS — M1712 Unilateral primary osteoarthritis, left knee: Secondary | ICD-10-CM | POA: Diagnosis not present

## 2020-08-27 DIAGNOSIS — M1711 Unilateral primary osteoarthritis, right knee: Secondary | ICD-10-CM | POA: Diagnosis not present

## 2020-08-27 DIAGNOSIS — M170 Bilateral primary osteoarthritis of knee: Secondary | ICD-10-CM | POA: Diagnosis not present

## 2020-08-28 DIAGNOSIS — E872 Acidosis: Secondary | ICD-10-CM | POA: Diagnosis not present

## 2020-11-10 ENCOUNTER — Other Ambulatory Visit: Payer: Self-pay | Admitting: Family Medicine

## 2020-12-08 ENCOUNTER — Telehealth: Payer: Self-pay | Admitting: Family Medicine

## 2020-12-08 NOTE — Telephone Encounter (Signed)
Forms placed in bin to be filled 

## 2020-12-08 NOTE — Telephone Encounter (Signed)
..  Type of form received:   Handicap License Renewal Additional comments: Patient sent stampled self addressed envelope so it can be mailed back to him  Received by: Maralyn Sago  Form should be Faxed to:  na  Form should be mailed to:  na  Is patient requesting call for pickup:no - Patient wants form mailed back to him   Form placed:  In Dr. Haze Justin bin up front  Attach charge sheet.  Provider will determine charge.  Individual made aware of 3-5 business day turn around Yes?

## 2021-01-14 DIAGNOSIS — N1832 Chronic kidney disease, stage 3b: Secondary | ICD-10-CM | POA: Diagnosis not present

## 2021-01-17 IMAGING — DX DG KNEE COMPLETE 4+V*R*
4 series · 4 of 4 positions shown · non-contrast
Comparison: No prior.

CLINICAL DATA: Knee pain.

EXAM:
RIGHT KNEE - COMPLETE 4+ VIEW

[knee ap]
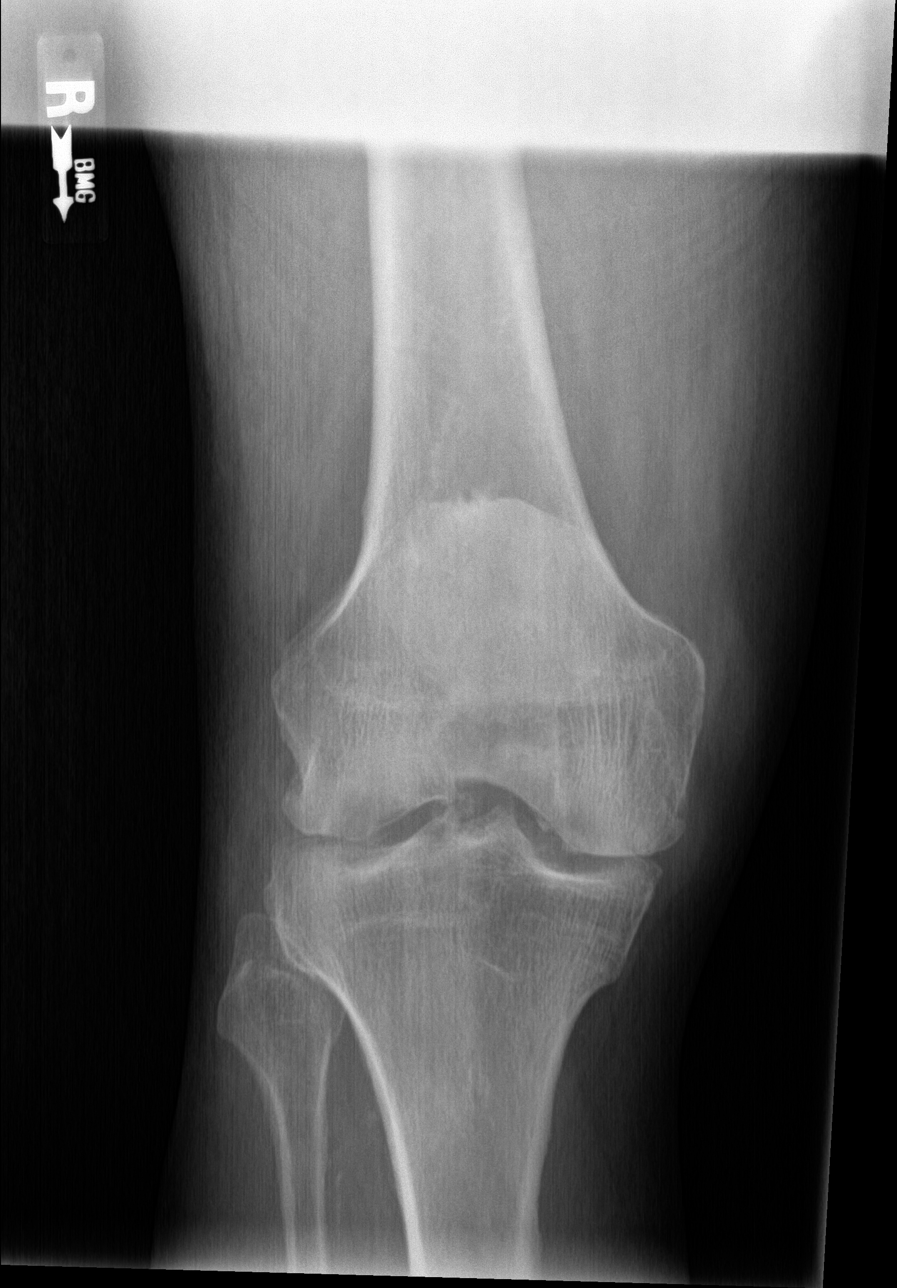

[knee lat]
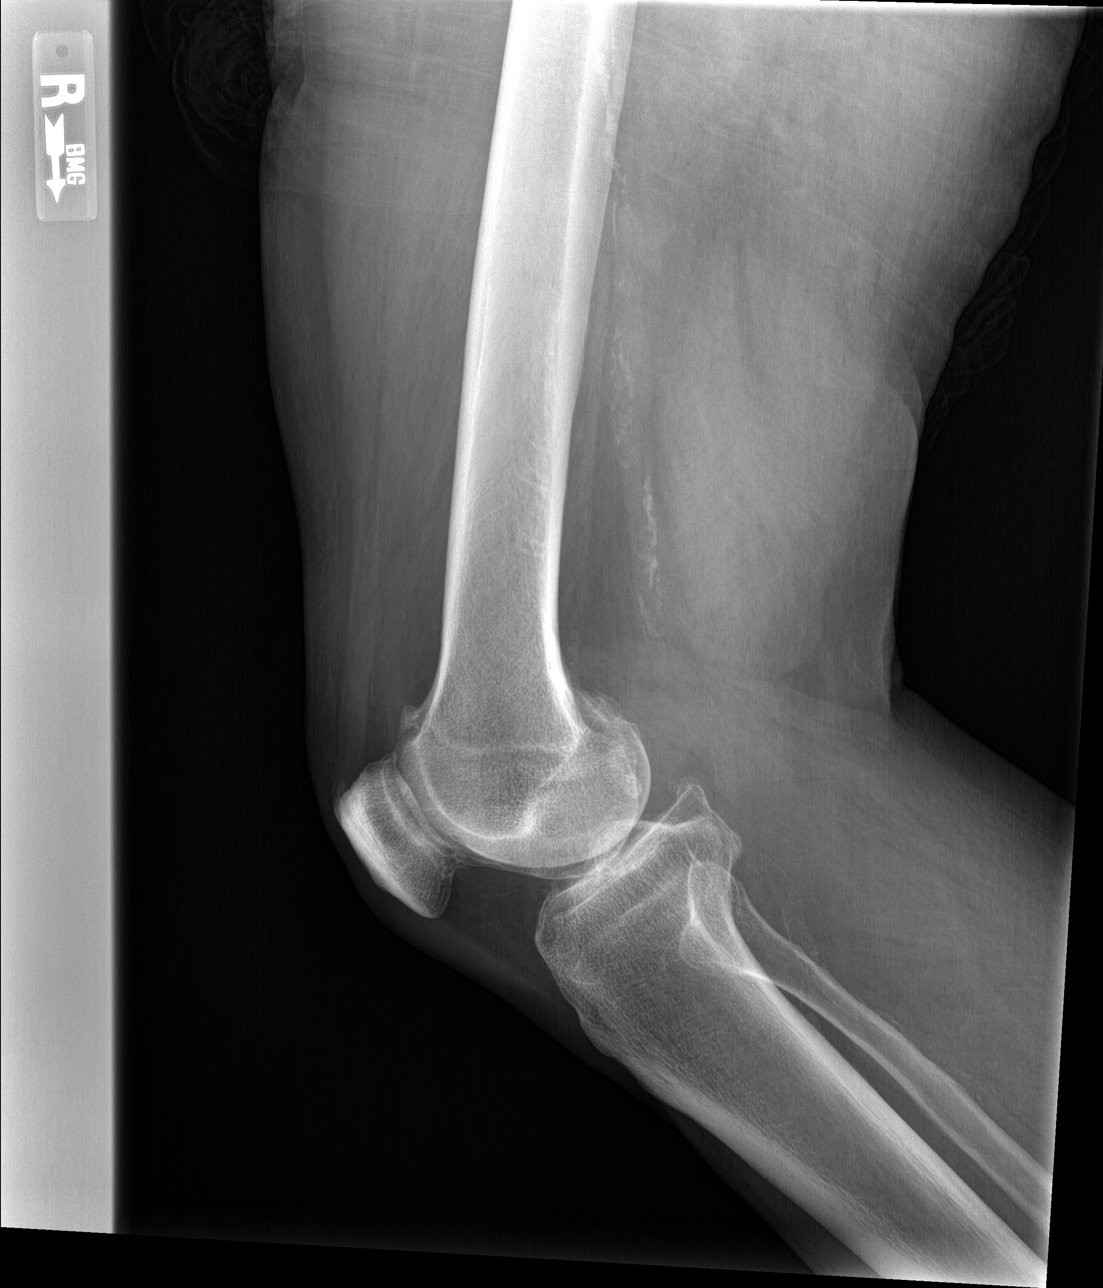

[sunrise]
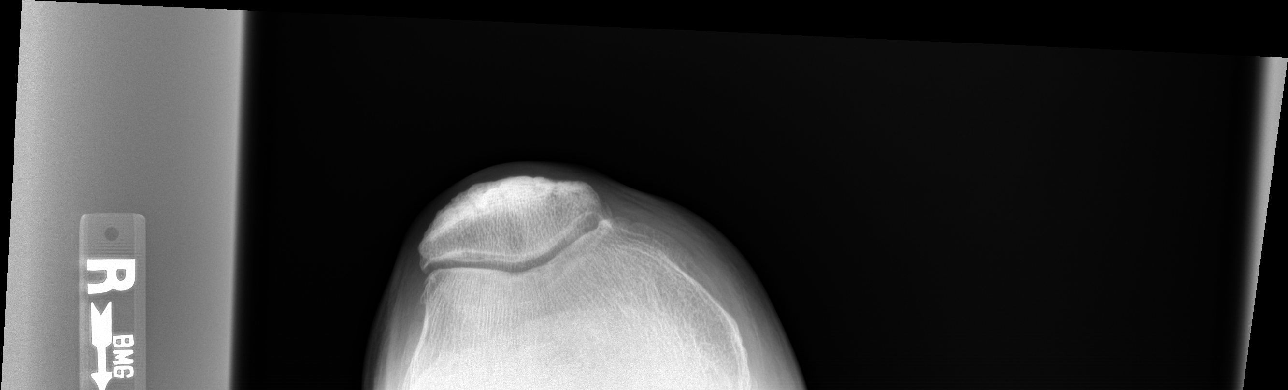

[knee [person_name]]
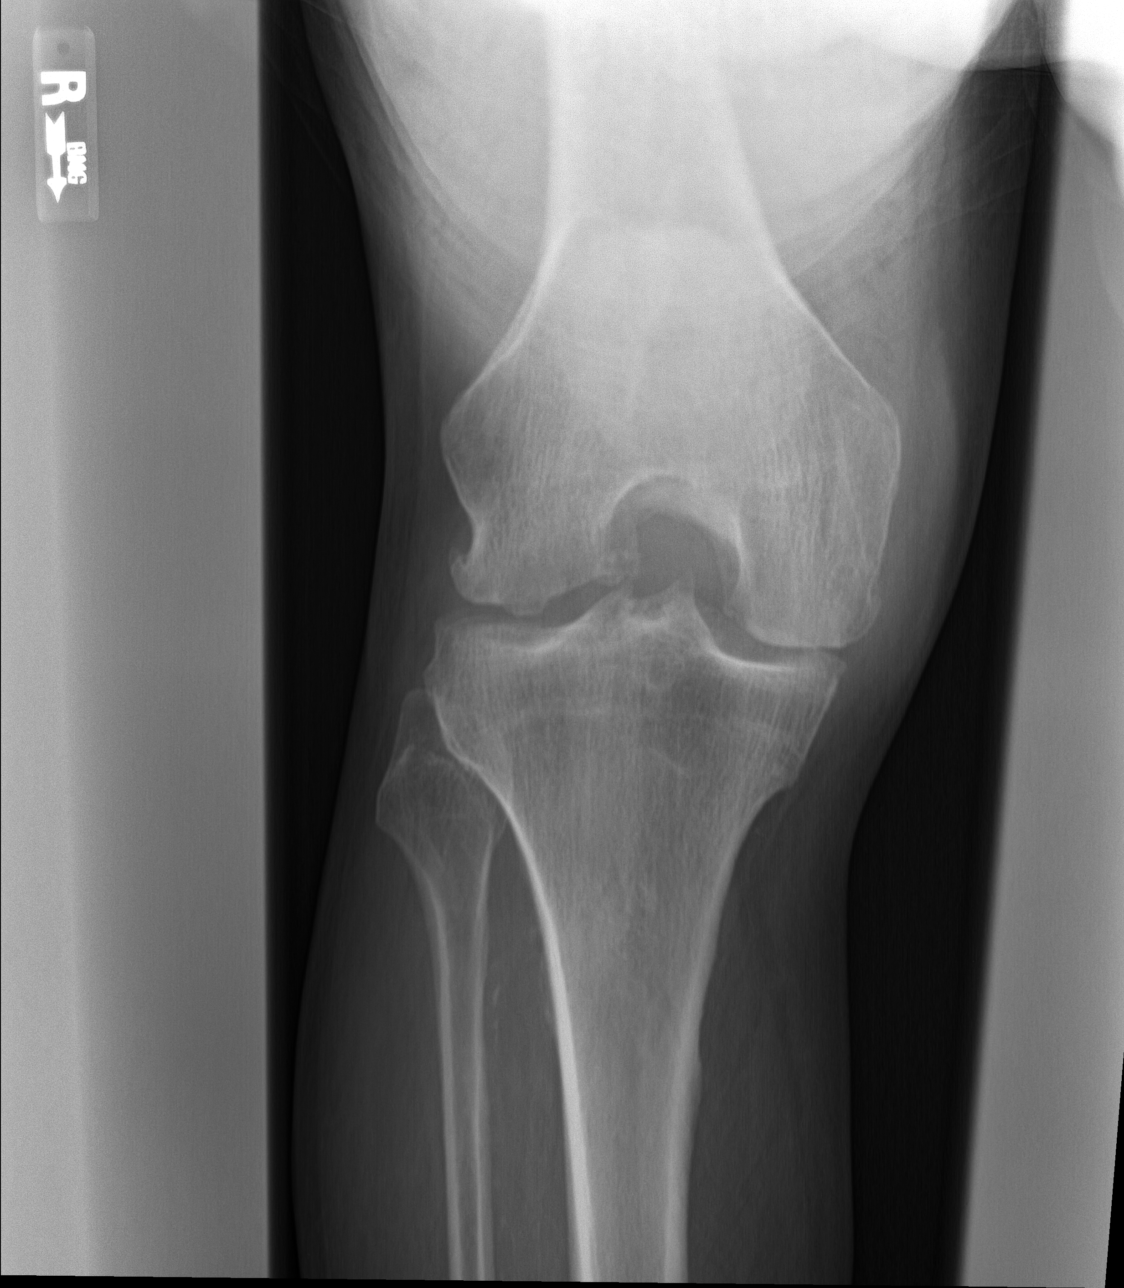

[4 of 4 positions shown; findings below may reference images not displayed]

FINDINGS: Severe tricompartment degenerative change. Loose bodies most likely
present. No acute bony abnormality identified. No evidence of
fracture. Peripheral vascular calcification.
IMPRESSION: 1. Severe tricompartment degenerative change. Loose bodies most
likely present. No acute bony abnormality identified.

2.  Peripheral vascular disease.

## 2021-01-21 DIAGNOSIS — N1832 Chronic kidney disease, stage 3b: Secondary | ICD-10-CM | POA: Diagnosis not present

## 2021-01-21 DIAGNOSIS — E039 Hypothyroidism, unspecified: Secondary | ICD-10-CM | POA: Diagnosis not present

## 2021-01-21 DIAGNOSIS — E875 Hyperkalemia: Secondary | ICD-10-CM | POA: Diagnosis not present

## 2021-01-21 DIAGNOSIS — E8729 Other acidosis: Secondary | ICD-10-CM | POA: Diagnosis not present

## 2021-01-21 DIAGNOSIS — E1122 Type 2 diabetes mellitus with diabetic chronic kidney disease: Secondary | ICD-10-CM | POA: Diagnosis not present

## 2021-01-21 DIAGNOSIS — E785 Hyperlipidemia, unspecified: Secondary | ICD-10-CM | POA: Diagnosis not present

## 2021-01-21 DIAGNOSIS — K219 Gastro-esophageal reflux disease without esophagitis: Secondary | ICD-10-CM | POA: Diagnosis not present

## 2021-01-21 DIAGNOSIS — I129 Hypertensive chronic kidney disease with stage 1 through stage 4 chronic kidney disease, or unspecified chronic kidney disease: Secondary | ICD-10-CM | POA: Diagnosis not present

## 2021-02-06 ENCOUNTER — Telehealth: Payer: Self-pay | Admitting: *Deleted

## 2021-02-06 NOTE — Telephone Encounter (Signed)
Jackson M. Called and left voicemail for patient to call us back to schedule. He is overdue for follow up with Dr. Neva Seat.

## 2021-03-19 ENCOUNTER — Other Ambulatory Visit: Payer: Self-pay | Admitting: Family Medicine

## 2021-03-19 DIAGNOSIS — E785 Hyperlipidemia, unspecified: Secondary | ICD-10-CM

## 2021-03-19 DIAGNOSIS — F411 Generalized anxiety disorder: Secondary | ICD-10-CM

## 2021-04-15 ENCOUNTER — Ambulatory Visit: Payer: HMO | Admitting: Family Medicine

## 2021-06-03 ENCOUNTER — Other Ambulatory Visit: Payer: Self-pay | Admitting: Family Medicine

## 2021-06-11 ENCOUNTER — Ambulatory Visit: Payer: HMO | Admitting: Family Medicine

## 2021-07-15 DIAGNOSIS — E1129 Type 2 diabetes mellitus with other diabetic kidney complication: Secondary | ICD-10-CM | POA: Diagnosis not present

## 2021-07-15 DIAGNOSIS — N1832 Chronic kidney disease, stage 3b: Secondary | ICD-10-CM | POA: Diagnosis not present

## 2021-07-20 DIAGNOSIS — N1832 Chronic kidney disease, stage 3b: Secondary | ICD-10-CM | POA: Diagnosis not present

## 2021-07-20 DIAGNOSIS — I129 Hypertensive chronic kidney disease with stage 1 through stage 4 chronic kidney disease, or unspecified chronic kidney disease: Secondary | ICD-10-CM | POA: Diagnosis not present

## 2021-07-20 DIAGNOSIS — E1122 Type 2 diabetes mellitus with diabetic chronic kidney disease: Secondary | ICD-10-CM | POA: Diagnosis not present

## 2021-07-20 DIAGNOSIS — E875 Hyperkalemia: Secondary | ICD-10-CM | POA: Diagnosis not present

## 2021-08-31 ENCOUNTER — Ambulatory Visit: Payer: HMO | Admitting: Family Medicine

## 2021-10-01 ENCOUNTER — Other Ambulatory Visit: Payer: Self-pay | Admitting: Family Medicine

## 2021-10-01 DIAGNOSIS — E785 Hyperlipidemia, unspecified: Secondary | ICD-10-CM

## 2021-10-05 ENCOUNTER — Ambulatory Visit (INDEPENDENT_AMBULATORY_CARE_PROVIDER_SITE_OTHER): Payer: HMO | Admitting: Family Medicine

## 2021-10-05 ENCOUNTER — Encounter: Payer: Self-pay | Admitting: Family Medicine

## 2021-10-05 VITALS — BP 124/68 | HR 101 | Temp 98.0°F | Resp 16 | Ht 69.0 in | Wt 257.6 lb

## 2021-10-05 DIAGNOSIS — R12 Heartburn: Secondary | ICD-10-CM

## 2021-10-05 DIAGNOSIS — R1310 Dysphagia, unspecified: Secondary | ICD-10-CM

## 2021-10-05 DIAGNOSIS — E039 Hypothyroidism, unspecified: Secondary | ICD-10-CM

## 2021-10-05 DIAGNOSIS — F411 Generalized anxiety disorder: Secondary | ICD-10-CM

## 2021-10-05 DIAGNOSIS — N1831 Chronic kidney disease, stage 3a: Secondary | ICD-10-CM | POA: Diagnosis not present

## 2021-10-05 DIAGNOSIS — E1122 Type 2 diabetes mellitus with diabetic chronic kidney disease: Secondary | ICD-10-CM

## 2021-10-05 DIAGNOSIS — E785 Hyperlipidemia, unspecified: Secondary | ICD-10-CM

## 2021-10-05 LAB — COMPREHENSIVE METABOLIC PANEL
ALT: 7 U/L (ref 0–53)
AST: 14 U/L (ref 0–37)
Albumin: 4.2 g/dL (ref 3.5–5.2)
Alkaline Phosphatase: 55 U/L (ref 39–117)
BUN: 28 mg/dL — ABNORMAL HIGH (ref 6–23)
CO2: 22 mEq/L (ref 19–32)
Calcium: 9.1 mg/dL (ref 8.4–10.5)
Chloride: 104 mEq/L (ref 96–112)
Creatinine, Ser: 1.98 mg/dL — ABNORMAL HIGH (ref 0.40–1.50)
GFR: 32.21 mL/min — ABNORMAL LOW (ref >60.00)
Glucose, Bld: 196 mg/dL — ABNORMAL HIGH (ref 70–99)
Potassium: 4.2 mEq/L (ref 3.5–5.1)
Sodium: 137 mEq/L (ref 135–145)
Total Bilirubin: 0.5 mg/dL (ref 0.2–1.2)
Total Protein: 6.7 g/dL (ref 6.0–8.3)

## 2021-10-05 LAB — LIPID PANEL
Cholesterol: 164 mg/dL (ref 0–200)
HDL: 33 mg/dL — ABNORMAL LOW (ref >39.00)
NonHDL: 130.51
Total CHOL/HDL Ratio: 5
Triglycerides: 399 mg/dL — ABNORMAL HIGH (ref 0.0–149.0)
VLDL: 79.8 mg/dL — ABNORMAL HIGH (ref 0.0–40.0)

## 2021-10-05 LAB — MICROALBUMIN / CREATININE URINE RATIO
Creatinine,U: 55.3 mg/dL
Microalb Creat Ratio: 1.3 mg/g (ref 0.0–30.0)
Microalb, Ur: <0.7 mg/dL (ref 0.0–1.9)

## 2021-10-05 LAB — LDL CHOLESTEROL, DIRECT: Direct LDL: 87 mg/dL

## 2021-10-05 LAB — TSH: TSH: 0.3 u[IU]/mL — ABNORMAL LOW (ref 0.35–5.50)

## 2021-10-05 MED ORDER — LEVOTHYROXINE SODIUM 125 MCG PO TABS: ORAL_TABLET | ORAL | 1 refills | Status: DC

## 2021-10-05 MED ORDER — ATORVASTATIN CALCIUM 10 MG PO TABS: 10.0000 mg | ORAL_TABLET | Freq: Every day | ORAL | 1 refills | Status: DC

## 2021-10-05 MED ORDER — FLUOXETINE HCL 20 MG PO CAPS: ORAL_CAPSULE | ORAL | 1 refills | Status: DC

## 2021-10-05 MED ORDER — OMEPRAZOLE 20 MG PO CPDR: 20.0000 mg | DELAYED_RELEASE_CAPSULE | Freq: Every day | ORAL | 3 refills | Status: DC

## 2021-10-05 NOTE — Patient Instructions (Addendum)
Schedule appointment with eye doctor and have them send me a report.  Try omeprazole once per day for possible heartburn, but call your gastroenterologist for appointment to discuss symptoms of food getting stuck. Let me know if they need a referral.  Return to the clinic or go to the nearest emergency room if any of your symptoms worsen or new symptoms occur.  Take care and thanks for coming in today.

## 2021-10-05 NOTE — Progress Notes (Signed)
Subjective:  Patient ID: BALEY LORIMER, male    DOB: 1945/10/23  Age: 76 y.o. MRN: [id number]  CC:  Chief Complaint  Patient presents with   Hypertension   Hyperlipidemia   Depression   Hypothyroidism    HPI JERETT ODONOHUE presents for  Follow-up.  Overdue for follow-up, last saw me in March 2022. Denies barriers to care, other than moving offices. Has been followed by nephrology.   Hypertension: With stage IIIb chronic kidney disease, followed by Dr. Valentino Nose with nephrology, last visit in April.  Slowly progressive CKD secondary to hypertension and diabetes causing arterionephrosclerosis.  Baseline creatinine approximately 1.5-2. Treated with lisinopril 20 mg daily, chlorthalidone 25 mg daily. Home readings: 120/60-70. No new side effects.  BP Readings from Last 3 Encounters:  10/05/21 124/68  06/20/20 (!) 147/83  01/14/20 126/70   Lab Results  Component Value Date   CREATININE 1.99 (H) 06/24/2020   Diabetes: With CKD as above.  Followed by nephrology, off metformin with history of metabolic acidosis with increased anion gap.  Possible cause of dietary miss with CKD and metformin previously.  Does take half dose of Jardiance, 12.5 mg daily. On ACE-I and statin.  No home readings.  Microalbumin: ordered today.  Optho, foot exam, pneumovax:  Scheduling optho appt - 2 yrs ago.   A1c 7.2 on 07/15/21 with nephrology.  Lab Results  Component Value Date   HGBA1C 7.3 (H) 06/24/2020   HGBA1C 7.1 (A) 12/14/2019   HGBA1C 7.5 (H) 06/15/2019   Lab Results  Component Value Date   MICROALBUR 0.3 04/10/2015   LDLCALC 73 06/24/2020   CREATININE 1.99 (H) 06/24/2020   Hyperlipidemia: Lipitor 10mg  qd. No new myalgias.  Lab Results  Component Value Date   CHOL 193 06/24/2020   HDL 32 (L) 06/24/2020   LDLCALC 73 06/24/2020   TRIG 564 (HH) 06/24/2020   CHOLHDL 6.0 (H) 06/24/2020   Lab Results  Component Value Date   ALT 7 06/24/2020   AST 15 06/24/2020   ALKPHOS 59  06/24/2020   BILITOT 0.3 06/24/2020   Hypothyroidism: Lab Results  Component Value Date   TSH 1.170 06/24/2020  Synthroid 125 mcg QD.  Taking medication daily.  No new hot or cold intolerance. No new hair or skin changes, heart palpitations or new fatigue. No new weight changes.   Knee pain for some time, treated by ortho, prior injections - plans to call ortho for follow up.   Anxiety, heartburn.  Longstanding use of Prozac, with control of symptoms, denies new side effects, and symptoms still well controlled on current dose. Some stress at times with heartburn at times. Had prior hiatal hernia repair. Feels like food is getting stuck again past 6-8 months.  Some belching/burping. No recent GI eval - Dr. 06/26/2020. Improves with slow/deep breathing. No regurgitation/vomiting.     10/05/2021   10:38 AM 01/14/2020   11:42 AM 12/14/2019    9:54 AM 07/12/2019    9:42 AM 06/15/2019   11:07 AM  Depression screen PHQ 2/9  Decreased Interest 0 0 0 0 0  Down, Depressed, Hopeless 0 0 0 0 0  PHQ - 2 Score 0 0 0 0 0       History Patient Active Problem List   Diagnosis Date Noted   Vertigo 04/12/2012   Diabetes (HCC) 02/22/2012   Hypertension 02/22/2012   GERD (gastroesophageal reflux disease) 02/22/2012   Hyperlipidemia 02/22/2012   Hypothyroid 02/22/2012   Arthritis 02/22/2012   Past Medical  History:  Diagnosis Date   Arthritis    Cataract    Diabetes mellitus without complication (HCC)    GERD (gastroesophageal reflux disease)    Hypertension    Thyroid disease    Past Surgical History:  Procedure Laterality Date   EYE SURGERY     FOOT SURGERY     HERNIA REPAIR     TONSILLECTOMY     Allergies  Allergen Reactions   Metoclopramide Hcl Other (See Comments)    Pt states that he" felt like something was crawling all over me"   Erythrocin Rash   Penicillins Rash    Has patient had a PCN reaction causing immediate rash, facial/tongue/throat swelling, SOB or lightheadedness  with hypotension: No Has patient had a PCN reaction causing severe rash involving mucus membranes or skin necrosis: No Has patient had a PCN reaction that required hospitalization No Has patient had a PCN reaction occurring within the last 10 years:No If all of the above answers are "NO", then may proceed with Cephalosporin use.    Prior to Admission medications   Medication Sig Start Date End Date Taking? Authorizing Provider  aspirin 81 MG tablet Take 1 tablet (81 mg total) by mouth daily. 05/26/11  Yes Collene Gobble, MD  atorvastatin (LIPITOR) 10 MG tablet TAKE 1 TABLET BY MOUTH EVERY DAY 03/19/21  Yes Shade Flood, MD  cholecalciferol (VITAMIN D) 1000 UNITS tablet Take 1 tablet (1,000 Units total) by mouth 2 (two) times daily. 05/26/11  Yes Collene Gobble, MD  FLUoxetine (PROZAC) 20 MG capsule TAKE 1 CAPSULE BY MOUTH EVERY DAY 03/19/21  Yes Shade Flood, MD  levothyroxine (SYNTHROID) 125 MCG tablet TAKE 1 TABLET BY MOUTH EVERY DAY BEFORE BREAKFAST 06/03/21  Yes Shade Flood, MD  lisinopril (ZESTRIL) 20 MG tablet Take 20 mg by mouth daily. 04/16/20  Yes [provider]   Social History   Socioeconomic History   Marital status: Married    Spouse name: Not on file   Number of children: Not on file   Years of education: Not on file   Highest education level: Not on file  Occupational History   Occupation: Retired  Tobacco Use   Smoking status: Never   Smokeless tobacco: Never  Vaping Use   Vaping Use: Never used  Substance and Sexual Activity   Alcohol use: No   Drug use: No   Sexual activity: Yes  Other Topics Concern   Not on file  Social History Narrative   Married   Education: Automotive engineer   Exercise: Yes   Social Determinants of Health   Financial Resource Strain: Not on file  Food Insecurity: No Food Insecurity (07/12/2019)   Hunger Vital Sign    Worried About Running Out of Food in the Last Year: Never true    Ran Out of Food in the Last Year:  Never true  Transportation Needs: No Transportation Needs (07/12/2019)   PRAPARE - Administrator, Civil Service (Medical): No    Lack of Transportation (Non-Medical): No  Physical Activity: Not on file  Stress: Not on file  Social Connections: Not on file  Intimate Partner Violence: Not on file    Review of Systems  Constitutional:  Negative for fatigue and unexpected weight change.  Eyes:  Negative for visual disturbance.  Respiratory:  Negative for cough, choking, chest tightness and shortness of breath.   Cardiovascular:  Negative for chest pain, palpitations and leg swelling.  Gastrointestinal:  Negative for  abdominal pain and blood in stool.  Neurological:  Negative for dizziness, light-headedness and headaches.     Objective:   Vitals:   10/05/21 1036  BP: 124/68  Pulse: (!) 101  Resp: 16  Temp: 98 F (36.7 C)  TempSrc: Temporal  SpO2: 96%  Weight: 257 lb 9.6 oz (116.8 kg)  Height: 5\' 9"  (1.753 m)     Physical Exam Vitals reviewed.  Constitutional:      Appearance: He is well-developed.  HENT:     Head: Normocephalic and atraumatic.  Neck:     Vascular: No carotid bruit or JVD.  Cardiovascular:     Rate and Rhythm: Normal rate and regular rhythm.     Heart sounds: Normal heart sounds. No murmur heard. Pulmonary:     Effort: Pulmonary effort is normal.     Breath sounds: Normal breath sounds. No rales.  Musculoskeletal:     Right lower leg: No edema.     Left lower leg: No edema.  Skin:    General: Skin is warm and dry.  Neurological:     Mental Status: He is alert and oriented to person, place, and time.  Psychiatric:        Mood and Affect: Mood normal.        Assessment & Plan:  TADD HOLTMEYER is a 76 y.o. male . Hypothyroidism, unspecified type - Plan: levothyroxine (SYNTHROID) 125 MCG tablet, Lipid panel, TSH  -Denies new symptoms, check labs, adjust meds accordingly based on results  Hyperlipidemia, unspecified  hyperlipidemia type - Plan: atorvastatin (LIPITOR) 10 MG tablet, Lipid panel  -Check labs, tolerating Lipitor continue same dose.  Anxiety state - Plan: FLUoxetine (PROZAC) 20 MG capsule, Comprehensive metabolic panel  -Stable with fluoxetine, continue same.  Type 2 diabetes mellitus with stage 3a chronic kidney disease, without long-term current use of insulin (HCC) - Plan: Lipid panel, Microalbumin / creatinine urine ratio  -Continue follow-up with nephrology.  Most recent A1c noted.  Check microalbumin creatinine ratio.  No med changes for now.  Heartburn - Plan: omeprazole (PRILOSEC) 20 MG capsule Dysphagia, unspecified type - Plan: omeprazole (PRILOSEC) 20 MG capsule  -Start PPI, advised he contact his gastroenterologist to discuss symptoms as potential need for repeat EGD.  Meds ordered this encounter  Medications   atorvastatin (LIPITOR) 10 MG tablet    Sig: Take 1 tablet (10 mg total) by mouth daily.    Dispense:  90 tablet    Refill:  1   FLUoxetine (PROZAC) 20 MG capsule    Sig: TAKE 1 CAPSULE BY MOUTH EVERY DAY    Dispense:  90 capsule    Refill:  1   levothyroxine (SYNTHROID) 125 MCG tablet    Sig: TAKE 1 TABLET BY MOUTH EVERY DAY BEFORE BREAKFAST    Dispense:  90 tablet    Refill:  1   omeprazole (PRILOSEC) 20 MG capsule    Sig: Take 1 capsule (20 mg total) by mouth daily.    Dispense:  30 capsule    Refill:  3   Patient Instructions  Schedule appointment with eye doctor and have them send me a report.  Try omeprazole once per day for possible heartburn, but call your gastroenterologist for appointment to discuss symptoms of food getting stuck. Let me know if they need a referral.  Return to the clinic or go to the nearest emergency room if any of your symptoms worsen or new symptoms occur.  Take care and thanks for coming in today.  Signed,   [name], MD [facility], [facility] 10/05/21 11:27  AM

## 2021-10-15 ENCOUNTER — Other Ambulatory Visit: Payer: Self-pay | Admitting: Family Medicine

## 2021-10-15 DIAGNOSIS — M170 Bilateral primary osteoarthritis of knee: Secondary | ICD-10-CM | POA: Diagnosis not present

## 2021-10-15 DIAGNOSIS — E039 Hypothyroidism, unspecified: Secondary | ICD-10-CM

## 2021-10-15 MED ORDER — LEVOTHYROXINE SODIUM 112 MCG PO TABS: 112.0000 ug | ORAL_TABLET | Freq: Every day | ORAL | 1 refills | Status: DC

## 2021-10-15 NOTE — Progress Notes (Signed)
See lab notes, TSH low, decreased dose of levothyroxine with recheck labs planned in 6 weeks.

## 2021-10-16 ENCOUNTER — Other Ambulatory Visit: Payer: Self-pay

## 2021-10-16 DIAGNOSIS — E039 Hypothyroidism, unspecified: Secondary | ICD-10-CM

## 2021-10-16 NOTE — Progress Notes (Signed)
Spoke w/ pt advised of lab results. He has made an apt for Aug 23 for lab only visit to repeat TSH and order is in .

## 2021-10-19 ENCOUNTER — Ambulatory Visit (INDEPENDENT_AMBULATORY_CARE_PROVIDER_SITE_OTHER): Payer: HMO | Admitting: Family Medicine

## 2021-10-19 ENCOUNTER — Encounter: Payer: Self-pay | Admitting: Family Medicine

## 2021-10-19 VITALS — BP 126/74 | HR 78 | Temp 98.3°F | Resp 16 | Ht 69.0 in | Wt 250.2 lb

## 2021-10-19 DIAGNOSIS — N1832 Chronic kidney disease, stage 3b: Secondary | ICD-10-CM

## 2021-10-19 DIAGNOSIS — L299 Pruritus, unspecified: Secondary | ICD-10-CM

## 2021-10-19 DIAGNOSIS — S90521A Blister (nonthermal), right ankle, initial encounter: Secondary | ICD-10-CM | POA: Diagnosis not present

## 2021-10-19 DIAGNOSIS — E1122 Type 2 diabetes mellitus with diabetic chronic kidney disease: Secondary | ICD-10-CM

## 2021-10-19 DIAGNOSIS — R600 Localized edema: Secondary | ICD-10-CM

## 2021-10-19 DIAGNOSIS — R21 Rash and other nonspecific skin eruption: Secondary | ICD-10-CM | POA: Diagnosis not present

## 2021-10-19 LAB — BASIC METABOLIC PANEL
BUN: 30 mg/dL — ABNORMAL HIGH (ref 6–23)
CO2: 23 mEq/L (ref 19–32)
Calcium: 9.4 mg/dL (ref 8.4–10.5)
Chloride: 102 mEq/L (ref 96–112)
Creatinine, Ser: 1.96 mg/dL — ABNORMAL HIGH (ref 0.40–1.50)
GFR: 32.6 mL/min — ABNORMAL LOW (ref >60.00)
Glucose, Bld: 124 mg/dL — ABNORMAL HIGH (ref 70–99)
Potassium: 4.6 mEq/L (ref 3.5–5.1)
Sodium: 135 mEq/L (ref 135–145)

## 2021-10-19 MED ORDER — FUROSEMIDE 20 MG PO TABS: 20.0000 mg | ORAL_TABLET | Freq: Every day | ORAL | 0 refills | Status: DC

## 2021-10-19 MED ORDER — MUPIROCIN 2 % EX OINT: 1.0000 | TOPICAL_OINTMENT | Freq: Two times a day (BID) | CUTANEOUS | 0 refills | Status: AC

## 2021-10-19 MED ORDER — POTASSIUM CHLORIDE CRYS ER 10 MEQ PO TBCR: 10.0000 meq | EXTENDED_RELEASE_TABLET | Freq: Every day | ORAL | 0 refills | Status: DC

## 2021-10-19 MED ORDER — VALACYCLOVIR HCL 1 G PO TABS: 1000.0000 mg | ORAL_TABLET | Freq: Three times a day (TID) | ORAL | 0 refills | Status: AC

## 2021-10-19 NOTE — Patient Instructions (Signed)
Swelling in legs can sometimes cause leg itching.  Take furosemide fluid pill once per day for the next 3 days along with a potassium pill.  That should significantly help the swelling in the legs.  Okay to use over-the-counter Aveeno lotion, Sarna lotion, or cortisone over itchy areas if that helps.  Apply the mupirocin ointment twice per day to the open the blisters/wounds.  Start Valtrex for possible early shingles on the right lower leg.  If any worsening of areas or not improving this week, return for recheck.  Peripheral Edema  Peripheral edema is swelling that is caused by a buildup of fluid. Peripheral edema most often affects the lower legs, ankles, and feet. It can also develop in the arms, hands, and face. The area of the body that has peripheral edema will look swollen. It may also feel heavy or warm. Your clothes may start to feel tight. Pressing on the area may make a temporary dent in your skin (pitting edema). You may not be able to move your swollen arm or leg as much as usual. There are many causes of peripheral edema. It can happen because of a complication of other conditions such as heart failure, kidney disease, or a problem with your circulation. It also can be a side effect of certain medicines or happen because of an infection. It often happens to women during pregnancy. Sometimes, the cause is not known. Follow these instructions at home: Managing pain, stiffness, and swelling  Raise (elevate) your legs while you are sitting or lying down. Move around often to prevent stiffness and to reduce swelling. Do not sit or stand for long periods of time. Do not wear tight clothing. Do not wear garters on your upper legs. Exercise your legs to get your circulation going. This helps to move the fluid back into your blood vessels, and it may help the swelling go down. Wear compression stockings as told by your health care provider. These stockings help to prevent blood clots and reduce  swelling in your legs. It is important that these are the correct size. These stockings should be prescribed by your doctor to prevent possible injuries. If elastic bandages or wraps are recommended, use them as told by your health care provider. Medicines Take over-the-counter and prescription medicines only as told by your health care provider. Your health care provider may prescribe medicine to help your body get rid of excess water (diuretic). Take this medicine if you are told to take it. General instructions Eat a low-salt (low-sodium) diet as told by your health care provider. Sometimes, eating less salt may reduce swelling. Pay attention to any changes in your symptoms. Moisturize your skin daily to help prevent skin from cracking and draining. Keep all follow-up visits. This is important. Contact a health care provider if: You have a fever. You have swelling in only one leg. You have increased swelling, redness, or pain in one or both of your legs. You have drainage or sores at the area where you have edema. Get help right away if: You have edema that starts suddenly or is getting worse, especially if you are pregnant or have a medical condition. You develop shortness of breath, especially when you are lying down. You have pain in your chest or abdomen. You feel weak. You feel like you will faint. These symptoms may be an emergency. Get help right away. Call 911. Do not wait to see if the symptoms will go away. Do not drive yourself to the  hospital. Summary Peripheral edema is swelling that is caused by a buildup of fluid. Peripheral edema most often affects the lower legs, ankles, and feet. Move around often to prevent stiffness and to reduce swelling. Do not sit or stand for long periods of time. Pay attention to any changes in your symptoms. Contact a health care provider if you have edema that starts suddenly or is getting worse, especially if you are pregnant or have a  medical condition. Get help right away if you develop shortness of breath, especially when lying down. This information is not intended to replace advice given to you by your health care provider. Make sure you discuss any questions you have with your health care provider. Document Revised: 11/17/2020 Document Reviewed: 11/17/2020 Elsevier Patient Education  2023 [employer].

## 2021-10-19 NOTE — Progress Notes (Signed)
Subjective:  Patient ID: Jackson Sellers, male    DOB: 04-28-1945  Age: 76 y.o. MRN: [id number]  CC:  Chief Complaint  Patient presents with   Rash    Pt reports itching and pains in his shins, blisters formed on Rt leg but not on Lt notes nothing has helped so far     HPI Jackson Sellers presents for  Lower leg rash Itching in lower legs past 2-3 weeks. Noticed some blistery rash start on R ankle 3 nights ago - moving up back of leg. New blister area with burning sensation. No blisters on left leg or other areas. No itching or rash anywhere other than lower legs.  Lower leg swelling past few weeks, no change in diet. Rare added salt. No dyspnea/chest pain.  Feeling well overall. Has not been working in yard or poison ivy exposure.  Tx: cream for dry skin. No relief. Diabetic cream.   He does have a history of chronic kidney disease, diabetes with last A1c 7.3 in March. Creat 1.99 recently - same as last year. Nephrology eval in April - Dr. Valentino Nose.  TSH low on 7/10, 0.30. on new dose med past 3 days.      History Patient Active Problem List   Diagnosis Date Noted   Vertigo 04/12/2012   Diabetes (HCC) 02/22/2012   Hypertension 02/22/2012   GERD (gastroesophageal reflux disease) 02/22/2012   Hyperlipidemia 02/22/2012   Hypothyroid 02/22/2012   Arthritis 02/22/2012   Past Medical History:  Diagnosis Date   Arthritis    Cataract    Diabetes mellitus without complication (HCC)    GERD (gastroesophageal reflux disease)    Hypertension    Thyroid disease    Past Surgical History:  Procedure Laterality Date   EYE SURGERY     FOOT SURGERY     HERNIA REPAIR     TONSILLECTOMY     Allergies  Allergen Reactions   Metoclopramide Hcl Other (See Comments)    Pt states that he" felt like something was crawling all over me"   Erythrocin Rash   Penicillins Rash    Has patient had a PCN reaction causing immediate rash, facial/tongue/throat swelling, SOB or lightheadedness  with hypotension: No Has patient had a PCN reaction causing severe rash involving mucus membranes or skin necrosis: No Has patient had a PCN reaction that required hospitalization No Has patient had a PCN reaction occurring within the last 10 years:No If all of the above answers are "NO", then may proceed with Cephalosporin use.    Prior to Admission medications   Medication Sig Start Date End Date Taking? Authorizing Provider  aspirin 81 MG tablet Take 1 tablet (81 mg total) by mouth daily. 05/26/11  Yes Collene Gobble, MD  atorvastatin (LIPITOR) 10 MG tablet Take 1 tablet (10 mg total) by mouth daily. 10/05/21  Yes Shade Flood, MD  chlorthalidone (HYGROTON) 25 MG tablet Take 25 mg by mouth daily.   Yes [provider]  cholecalciferol (VITAMIN D) 1000 UNITS tablet Take 1 tablet (1,000 Units total) by mouth 2 (two) times daily. 05/26/11  Yes Collene Gobble, MD  empagliflozin (JARDIANCE) 25 MG TABS tablet Take 12.5 mg by mouth daily.   Yes [provider]  FLUoxetine (PROZAC) 20 MG capsule TAKE 1 CAPSULE BY MOUTH EVERY DAY 10/05/21  Yes Shade Flood, MD  levothyroxine (SYNTHROID) 112 MCG tablet Take 1 tablet (112 mcg total) by mouth daily before breakfast. TAKE 1 TABLET BY MOUTH  EVERY DAY BEFORE BREAKFAST 10/15/21  Yes Shade Flood, MD  lisinopril (ZESTRIL) 20 MG tablet Take 20 mg by mouth daily. 04/16/20  Yes [provider]  omeprazole (PRILOSEC) 20 MG capsule Take 1 capsule (20 mg total) by mouth daily. 10/05/21  Yes Shade Flood, MD   Social History   Socioeconomic History   Marital status: Married    Spouse name: Not on file   Number of children: Not on file   Years of education: Not on file   Highest education level: Not on file  Occupational History   Occupation: Retired  Tobacco Use   Smoking status: Never   Smokeless tobacco: Never  Vaping Use   Vaping Use: Never used  Substance and Sexual Activity   Alcohol use: No   Drug use: No    Sexual activity: Yes  Other Topics Concern   Not on file  Social History Narrative   Married   Education: Automotive engineer   Exercise: Yes   Social Determinants of Health   Financial Resource Strain: Not on file  Food Insecurity: No Food Insecurity (07/12/2019)   Hunger Vital Sign    Worried About Running Out of Food in the Last Year: Never true    Ran Out of Food in the Last Year: Never true  Transportation Needs: No Transportation Needs (07/12/2019)   PRAPARE - Administrator, Civil Service (Medical): No    Lack of Transportation (Non-Medical): No  Physical Activity: Not on file  Stress: Not on file  Social Connections: Not on file  Intimate Partner Violence: Not on file    Review of Systems Per HPI.   Objective:   Vitals:   10/19/21 1143  BP: 126/74  Pulse: 78  Resp: 16  Temp: 98.3 F (36.8 C)  TempSrc: Oral  SpO2: 97%  Weight: 250 lb 3.2 oz (113.5 kg)  Height: 5\' 9"  (1.753 m)     Physical Exam Constitutional:      General: He is not in acute distress.    Appearance: Normal appearance. He is well-developed.  HENT:     Head: Normocephalic and atraumatic.  Cardiovascular:     Rate and Rhythm: Normal rate.  Pulmonary:     Effort: Pulmonary effort is normal.  Musculoskeletal:     Right lower leg: Edema (1-2+2 proximal tibia on right, 1+ to proximal tibia on left.  Calves nontender.  Negative Homans.) present.     Left lower leg: Edema present.  Skin:    Comments: Few vesicles on the right medial ankle, one open without significant surrounding erythema or discharge. Tight skin lower legs with some edema into feet as well.  Toes warm.  See photos.  Neurological:     Mental Status: He is alert and oriented to person, place, and time.  Psychiatric:        Mood and Affect: Mood normal.          Assessment & Plan:  Jackson Sellers is a 76 y.o. male . Pedal edema , with history of diabetes, CKD.  Plan: Basic metabolic panel, furosemide (LASIX) 20  MG tablet, potassium chloride (KLOR-CON M) 10 MEQ tablet  -With recent increased swelling, could be related to CKD versus diet.  Denies pulmonary or cardiac symptoms.  Less likely CHF. Overtreated hypothyroidism recently with recent dosage adjustment.    -Check updated renal function with BMP.  Short-term treatment with furosemide 20 mg daily x3 days with potassium 10 mill equivalents daily.  Potential  side effects discussed.  RTC precautions if edema not improving or worsening.  Handout given on peripheral edema as well as salty 6 to minimize sodium in diet.  Rash and nonspecific skin eruption - Plan: valACYclovir (VALTREX) 1000 MG tablet, mupirocin ointment (BACTROBAN) 2 % Pruritus Blister of right ankle, initial encounter - Plan: mupirocin ointment (BACTROBAN) 2 %  -Unilateral vesicular rash differential includes zoster.  Start Valtrex.  Mupirocin to open wounds.  Could be related to tight skin and excoriation from pruritus.  Topical treatments discussed with cortisone, Aveeno, Sarna if needed.  RTC precautions given.  Meds ordered this encounter  Medications   furosemide (LASIX) 20 MG tablet    Sig: Take 1 tablet (20 mg total) by mouth daily.    Dispense:  3 tablet    Refill:  0   valACYclovir (VALTREX) 1000 MG tablet    Sig: Take 1 tablet (1,000 mg total) by mouth 3 (three) times daily.    Dispense:  21 tablet    Refill:  0   potassium chloride (KLOR-CON M) 10 MEQ tablet    Sig: Take 1 tablet (10 mEq total) by mouth daily.    Dispense:  3 tablet    Refill:  0   mupirocin ointment (BACTROBAN) 2 %    Sig: Apply 1 Application topically 2 (two) times daily. Top open wounds for 1 week.    Dispense:  22 g    Refill:  0   Patient Instructions  Swelling in legs can sometimes cause leg itching.  Take furosemide fluid pill once per day for the next 3 days along with a potassium pill.  That should significantly help the swelling in the legs.  Okay to use over-the-counter Aveeno lotion, Sarna  lotion, or cortisone over itchy areas if that helps.  Apply the mupirocin ointment twice per day to the open the blisters/wounds.  Start Valtrex for possible early shingles on the right lower leg.  If any worsening of areas or not improving this week, return for recheck.  Peripheral Edema  Peripheral edema is swelling that is caused by a buildup of fluid. Peripheral edema most often affects the lower legs, ankles, and feet. It can also develop in the arms, hands, and face. The area of the body that has peripheral edema will look swollen. It may also feel heavy or warm. Your clothes may start to feel tight. Pressing on the area may make a temporary dent in your skin (pitting edema). You may not be able to move your swollen arm or leg as much as usual. There are many causes of peripheral edema. It can happen because of a complication of other conditions such as heart failure, kidney disease, or a problem with your circulation. It also can be a side effect of certain medicines or happen because of an infection. It often happens to women during pregnancy. Sometimes, the cause is not known. Follow these instructions at home: Managing pain, stiffness, and swelling  Raise (elevate) your legs while you are sitting or lying down. Move around often to prevent stiffness and to reduce swelling. Do not sit or stand for long periods of time. Do not wear tight clothing. Do not wear garters on your upper legs. Exercise your legs to get your circulation going. This helps to move the fluid back into your blood vessels, and it may help the swelling go down. Wear compression stockings as told by your health care provider. These stockings help to prevent blood clots and reduce swelling  in your legs. It is important that these are the correct size. These stockings should be prescribed by your doctor to prevent possible injuries. If elastic bandages or wraps are recommended, use them as told by your health care  provider. Medicines Take over-the-counter and prescription medicines only as told by your health care provider. Your health care provider may prescribe medicine to help your body get rid of excess water (diuretic). Take this medicine if you are told to take it. General instructions Eat a low-salt (low-sodium) diet as told by your health care provider. Sometimes, eating less salt may reduce swelling. Pay attention to any changes in your symptoms. Moisturize your skin daily to help prevent skin from cracking and draining. Keep all follow-up visits. This is important. Contact a health care provider if: You have a fever. You have swelling in only one leg. You have increased swelling, redness, or pain in one or both of your legs. You have drainage or sores at the area where you have edema. Get help right away if: You have edema that starts suddenly or is getting worse, especially if you are pregnant or have a medical condition. You develop shortness of breath, especially when you are lying down. You have pain in your chest or abdomen. You feel weak. You feel like you will faint. These symptoms may be an emergency. Get help right away. Call 911. Do not wait to see if the symptoms will go away. Do not drive yourself to the hospital. Summary Peripheral edema is swelling that is caused by a buildup of fluid. Peripheral edema most often affects the lower legs, ankles, and feet. Move around often to prevent stiffness and to reduce swelling. Do not sit or stand for long periods of time. Pay attention to any changes in your symptoms. Contact a health care provider if you have edema that starts suddenly or is getting worse, especially if you are pregnant or have a medical condition. Get help right away if you develop shortness of breath, especially when lying down. This information is not intended to replace advice given to you by your health care provider. Make sure you discuss any questions you have  with your health care provider. Document Revised: 11/17/2020 Document Reviewed: 11/17/2020 Elsevier Patient Education  2023 Elsevier Inc.     Signed,   [name], MD [facility], [facility] 10/19/21 12:30 PM

## 2021-10-22 ENCOUNTER — Telehealth: Payer: Self-pay | Admitting: Family Medicine

## 2021-10-22 DIAGNOSIS — R600 Localized edema: Secondary | ICD-10-CM

## 2021-10-22 NOTE — Telephone Encounter (Signed)
Clarified with pt no concerns with the medication but Wondered if he should take more of the medications to lessen the swelling further

## 2021-10-22 NOTE — Telephone Encounter (Signed)
Pt was advised to call in end of week if not better, no questions about meds.   Notes continued swelling in his Rt leg less in Lt and is having increase itching at the site of rash.   Pt was advised of lab results

## 2021-10-22 NOTE — Telephone Encounter (Signed)
Caller name:  Jackson Sellers (pt)  On DPR? :yes/no: Yes  Call back number: [phone]  Provider they see: Dr. Neva Seat  Reason for call: calling about Klor-con 10 meq, furosemide 20 mg.  States that swelling in left leg has decreased a little, but right leg is still swollen.  Wants to know what he needs to do for that.  Also calling about lab work that was done on Monday 10/19/21

## 2021-10-23 MED ORDER — POTASSIUM CHLORIDE CRYS ER 10 MEQ PO TBCR: 10.0000 meq | EXTENDED_RELEASE_TABLET | Freq: Every day | ORAL | 0 refills | Status: AC

## 2021-10-23 MED ORDER — FUROSEMIDE 20 MG PO TABS: 20.0000 mg | ORAL_TABLET | Freq: Every day | ORAL | 0 refills | Status: AC

## 2021-10-23 NOTE — Telephone Encounter (Signed)
Appreciate the update.  Creatinine is still elevated but similar to previous range.  If the furosemide helped with swelling, could extend that for a few more days but we need to keep a close eye on his kidney function.  Can he come by today to have a lab visit?  I will order a few more days of furosemide and potassium.  If any new or worsening rash should be seen for appointment or urgent care/ER over the weekend if worse. If the right leg has not improved, would recommend imaging.  Let me know and I can place an order for that today as well.

## 2021-10-26 DIAGNOSIS — N1832 Chronic kidney disease, stage 3b: Secondary | ICD-10-CM | POA: Diagnosis not present

## 2021-11-18 ENCOUNTER — Other Ambulatory Visit (INDEPENDENT_AMBULATORY_CARE_PROVIDER_SITE_OTHER): Payer: HMO

## 2021-11-18 DIAGNOSIS — E039 Hypothyroidism, unspecified: Secondary | ICD-10-CM

## 2021-11-18 LAB — TSH: TSH: 1.76 u[IU]/mL (ref 0.35–5.50)

## 2021-11-24 ENCOUNTER — Telehealth: Payer: Self-pay

## 2021-11-24 NOTE — Telephone Encounter (Signed)
Informed pt of lab results  

## 2021-11-24 NOTE — Telephone Encounter (Signed)
-----   Message from Shade Flood, MD sent at 11/24/2021  9:00 AM EDT ----- Lab letter  Good news your thyroid test was normal.  Keep follow-up as scheduled.  Let me know if there are questions.  Dr. Neva Seat

## 2021-12-09 ENCOUNTER — Telehealth: Payer: Self-pay

## 2021-12-09 NOTE — Telephone Encounter (Signed)
Called patient x 3 with no answer, unable to leave vm , mailbox full. Patient may reschedule for the next available appointment. L.Wilson,LPN

## 2021-12-31 NOTE — Telephone Encounter (Signed)
I spoke to patient.  Patient said he left a message and he sent a text that he wasn't interested in doing the AWV.

## 2022-01-31 ENCOUNTER — Other Ambulatory Visit: Payer: Self-pay | Admitting: Family Medicine

## 2022-01-31 DIAGNOSIS — R12 Heartburn: Secondary | ICD-10-CM

## 2022-01-31 DIAGNOSIS — R1310 Dysphagia, unspecified: Secondary | ICD-10-CM

## 2022-04-01 ENCOUNTER — Telehealth: Payer: Self-pay | Admitting: Family Medicine

## 2022-04-01 NOTE — Telephone Encounter (Signed)
Pt brought in application for disability plate. Place in front bin.

## 2022-04-02 NOTE — Telephone Encounter (Signed)
Paperwork completed and placed in CMA bin at back nurse station  

## 2022-04-02 NOTE — Telephone Encounter (Signed)
Sent out

## 2022-04-02 NOTE — Telephone Encounter (Signed)
In your sign folder.

## 2022-04-03 ENCOUNTER — Other Ambulatory Visit: Payer: Self-pay | Admitting: Family Medicine

## 2022-04-03 DIAGNOSIS — F411 Generalized anxiety disorder: Secondary | ICD-10-CM

## 2022-04-03 DIAGNOSIS — E785 Hyperlipidemia, unspecified: Secondary | ICD-10-CM

## 2022-04-03 DIAGNOSIS — E039 Hypothyroidism, unspecified: Secondary | ICD-10-CM

## 2022-07-03 ENCOUNTER — Other Ambulatory Visit: Payer: Self-pay | Admitting: Family Medicine

## 2022-07-03 DIAGNOSIS — R1310 Dysphagia, unspecified: Secondary | ICD-10-CM

## 2022-07-03 DIAGNOSIS — R12 Heartburn: Secondary | ICD-10-CM

## 2022-07-15 DIAGNOSIS — N1832 Chronic kidney disease, stage 3b: Secondary | ICD-10-CM | POA: Diagnosis not present

## 2022-07-23 DIAGNOSIS — E785 Hyperlipidemia, unspecified: Secondary | ICD-10-CM | POA: Diagnosis not present

## 2022-07-23 DIAGNOSIS — N1832 Chronic kidney disease, stage 3b: Secondary | ICD-10-CM | POA: Diagnosis not present

## 2022-07-23 DIAGNOSIS — E039 Hypothyroidism, unspecified: Secondary | ICD-10-CM | POA: Diagnosis not present

## 2022-07-23 DIAGNOSIS — E1122 Type 2 diabetes mellitus with diabetic chronic kidney disease: Secondary | ICD-10-CM | POA: Diagnosis not present

## 2022-07-23 DIAGNOSIS — I129 Hypertensive chronic kidney disease with stage 1 through stage 4 chronic kidney disease, or unspecified chronic kidney disease: Secondary | ICD-10-CM | POA: Diagnosis not present

## 2022-07-23 DIAGNOSIS — E8729 Other acidosis: Secondary | ICD-10-CM | POA: Diagnosis not present

## 2022-08-03 ENCOUNTER — Encounter: Payer: Self-pay | Admitting: Podiatry

## 2022-08-03 ENCOUNTER — Ambulatory Visit: Payer: PPO | Admitting: Podiatry

## 2022-08-03 DIAGNOSIS — M79674 Pain in right toe(s): Secondary | ICD-10-CM | POA: Diagnosis not present

## 2022-08-03 DIAGNOSIS — E1142 Type 2 diabetes mellitus with diabetic polyneuropathy: Secondary | ICD-10-CM

## 2022-08-03 DIAGNOSIS — B351 Tinea unguium: Secondary | ICD-10-CM

## 2022-08-03 DIAGNOSIS — M79675 Pain in left toe(s): Secondary | ICD-10-CM | POA: Diagnosis not present

## 2022-08-03 NOTE — Progress Notes (Signed)
  Subjective:  Patient ID: TU BRITTING, male    DOB: Sep 28, 1945,   MRN: [id number]  Chief Complaint  Patient presents with   Diabetes    Diabetic nail exam  A1C - not updated in the system    77 y.o. male presents for concern of thickened elongated and painful nails that are difficult to trim. Requesting to have them trimmed today. Relates burning and tingling in their feet. Patient is diabetic and last A1c was  Lab Results  Component Value Date   HGBA1C 7.3 (H) 06/24/2020   .   PCP:  Shade Flood, MD    . Denies any other pedal complaints. Denies n/v/f/c.   Past Medical History:  Diagnosis Date   Arthritis    Cataract    Diabetes mellitus without complication (HCC)    GERD (gastroesophageal reflux disease)    Hypertension    Thyroid disease     Objective:  Physical Exam: Vascular: DP/PT pulses 2/4 bilateral. CFT <3 seconds. Absent hair growth on digits. Edema noted to bilateral lower extremities. Xerosis noted bilaterally.  Skin. No lacerations or abrasions bilateral feet. Nails 1-5 bilateral  are thickened discolored and elongated with subungual debris.  Musculoskeletal: MMT 5/5 bilateral lower extremities in DF, PF, Inversion and Eversion. Deceased ROM in DF of ankle joint.  Neurological: Sensation intact to light touch. Protective sensation diminished bilateral.    Assessment:   1. Pain due to onychomycosis of toenails of both feet   2. Type 2 diabetes mellitus with diabetic polyneuropathy, without long-term current use of insulin (HCC)      Plan:  Patient was evaluated and treated and all questions answered. -Discussed and educated patient on diabetic foot care, especially with  regards to the vascular, neurological and musculoskeletal systems.  -Stressed the importance of good glycemic control and the detriment of not  controlling glucose levels in relation to the foot. -Discussed supportive shoes at all times and checking feet regularly.   -Mechanically debrided all nails 1-5 bilateral using sterile nail nipper and filed with dremel without incident  -Answered all patient questions -Patient to return  in 3 months for at risk foot care -Patient advised to call the office if any problems or questions arise in the meantime.   Louann Sjogren, DPM

## 2022-09-15 ENCOUNTER — Other Ambulatory Visit: Payer: Self-pay | Admitting: Family Medicine

## 2022-09-15 DIAGNOSIS — E785 Hyperlipidemia, unspecified: Secondary | ICD-10-CM

## 2022-09-15 DIAGNOSIS — E039 Hypothyroidism, unspecified: Secondary | ICD-10-CM

## 2022-09-15 DIAGNOSIS — F411 Generalized anxiety disorder: Secondary | ICD-10-CM

## 2022-09-27 DIAGNOSIS — H524 Presbyopia: Secondary | ICD-10-CM | POA: Diagnosis not present

## 2022-09-27 DIAGNOSIS — H04123 Dry eye syndrome of bilateral lacrimal glands: Secondary | ICD-10-CM | POA: Diagnosis not present

## 2022-09-27 DIAGNOSIS — H35372 Puckering of macula, left eye: Secondary | ICD-10-CM | POA: Diagnosis not present

## 2022-09-27 DIAGNOSIS — H26492 Other secondary cataract, left eye: Secondary | ICD-10-CM | POA: Diagnosis not present

## 2022-09-27 DIAGNOSIS — H35362 Drusen (degenerative) of macula, left eye: Secondary | ICD-10-CM | POA: Diagnosis not present

## 2022-09-27 DIAGNOSIS — E119 Type 2 diabetes mellitus without complications: Secondary | ICD-10-CM | POA: Diagnosis not present

## 2022-09-27 DIAGNOSIS — D23112 Other benign neoplasm of skin of right lower eyelid, including canthus: Secondary | ICD-10-CM | POA: Diagnosis not present

## 2022-09-27 LAB — HM DIABETES EYE EXAM

## 2022-09-28 ENCOUNTER — Telehealth: Payer: Self-pay

## 2022-09-28 ENCOUNTER — Encounter: Payer: Self-pay | Admitting: Family Medicine

## 2022-09-28 NOTE — Telephone Encounter (Signed)
Pt called back and informed of results.

## 2022-09-28 NOTE — Telephone Encounter (Signed)
Called patient to discuss result, no answer, LM to have him call back or review via MyChart

## 2022-09-28 NOTE — Telephone Encounter (Signed)
-----   Message from Alveria Apley, NP sent at 09/28/2022  1:42 PM EDT ----- Your diabetic eye exam showed no retinopathy. I will send a message to Dr. Neva Seat since he is out of the office this week.

## 2022-10-09 ENCOUNTER — Other Ambulatory Visit: Payer: Self-pay | Admitting: Family Medicine

## 2022-10-09 DIAGNOSIS — F411 Generalized anxiety disorder: Secondary | ICD-10-CM

## 2022-10-09 DIAGNOSIS — E785 Hyperlipidemia, unspecified: Secondary | ICD-10-CM

## 2022-10-09 DIAGNOSIS — E039 Hypothyroidism, unspecified: Secondary | ICD-10-CM

## 2022-10-18 ENCOUNTER — Telehealth: Payer: Self-pay

## 2022-10-20 NOTE — Telephone Encounter (Signed)
Reached out to patient to set up follow up visit with provider to discuss chronic conditions.  Telephone encounter attempt : 2   A HIPAA compliant voice message was left requesting a return call.  Instructed patient to call office or to call me at [phone].  Elijio Miles [facility]

## 2022-10-21 ENCOUNTER — Encounter: Payer: HMO | Admitting: Family Medicine

## 2022-10-27 NOTE — Telephone Encounter (Signed)
Reached out to patient to set up follow up visit with provider to discuss chronic conditions.  Telephone encounter attempt : 3   A HIPAA compliant voice message was left requesting a return call.  Instructed patient to call office or to call me at [phone].  Elijio Miles [facility]

## 2022-11-08 ENCOUNTER — Ambulatory Visit (INDEPENDENT_AMBULATORY_CARE_PROVIDER_SITE_OTHER): Payer: PPO | Admitting: Podiatry

## 2022-11-08 ENCOUNTER — Encounter: Payer: Self-pay | Admitting: Podiatry

## 2022-11-08 DIAGNOSIS — E1142 Type 2 diabetes mellitus with diabetic polyneuropathy: Secondary | ICD-10-CM | POA: Diagnosis not present

## 2022-11-08 DIAGNOSIS — M79674 Pain in right toe(s): Secondary | ICD-10-CM

## 2022-11-08 DIAGNOSIS — M79675 Pain in left toe(s): Secondary | ICD-10-CM

## 2022-11-08 DIAGNOSIS — B351 Tinea unguium: Secondary | ICD-10-CM

## 2022-11-08 NOTE — Progress Notes (Signed)
  Subjective:  Patient ID: Jackson Sellers, male    DOB: 09-Jun-1945,   MRN: [id number]  Chief Complaint  Patient presents with   Nail Problem    Patient is here for nail cutting and cleaning    77 y.o. male presents for concern of thickened elongated and painful nails that are difficult to trim. Requesting to have them trimmed today. Relates burning and tingling in their feet. Patient is diabetic and last A1c was  Lab Results  Component Value Date   HGBA1C 7.3 (H) 06/24/2020   .   PCP:  Shade Flood, MD    . Denies any other pedal complaints. Denies n/v/f/c.   Past Medical History:  Diagnosis Date   Arthritis    Cataract    Diabetes mellitus without complication (HCC)    GERD (gastroesophageal reflux disease)    Hypertension    Thyroid disease     Objective:  Physical Exam: Vascular: DP/PT pulses 2/4 bilateral. CFT <3 seconds. Absent hair growth on digits. Edema noted to bilateral lower extremities. Xerosis noted bilaterally.  Skin. No lacerations or abrasions bilateral feet. Nails 1-5 bilateral  are thickened discolored and elongated with subungual debris.  Musculoskeletal: MMT 5/5 bilateral lower extremities in DF, PF, Inversion and Eversion. Deceased ROM in DF of ankle joint.  Neurological: Sensation intact to light touch. Protective sensation diminished bilateral.    Assessment:   1. Pain due to onychomycosis of toenails of both feet   2. Type 2 diabetes mellitus with diabetic polyneuropathy, without long-term current use of insulin (HCC)      Plan:  Patient was evaluated and treated and all questions answered. -Discussed and educated patient on diabetic foot care, especially with  regards to the vascular, neurological and musculoskeletal systems.  -Stressed the importance of good glycemic control and the detriment of not  controlling glucose levels in relation to the foot. -Discussed supportive shoes at all times and checking feet regularly.  -Mechanically  debrided all nails 1-5 bilateral using sterile nail nipper and filed with dremel without incident  -Answered all patient questions -Patient to return  in 3 months for at risk foot care -Patient advised to call the office if any problems or questions arise in the meantime.   Louann Sjogren, DPM

## 2023-01-18 ENCOUNTER — Other Ambulatory Visit: Payer: Self-pay | Admitting: Family Medicine

## 2023-01-18 DIAGNOSIS — R1310 Dysphagia, unspecified: Secondary | ICD-10-CM

## 2023-01-18 DIAGNOSIS — R12 Heartburn: Secondary | ICD-10-CM

## 2023-02-08 ENCOUNTER — Ambulatory Visit: Payer: PPO | Admitting: Podiatry

## 2023-02-08 DIAGNOSIS — M79675 Pain in left toe(s): Secondary | ICD-10-CM | POA: Diagnosis not present

## 2023-02-08 DIAGNOSIS — B351 Tinea unguium: Secondary | ICD-10-CM | POA: Diagnosis not present

## 2023-02-08 DIAGNOSIS — M79674 Pain in right toe(s): Secondary | ICD-10-CM

## 2023-02-08 DIAGNOSIS — E1142 Type 2 diabetes mellitus with diabetic polyneuropathy: Secondary | ICD-10-CM | POA: Diagnosis not present

## 2023-02-08 NOTE — Progress Notes (Signed)
  Subjective:  Patient ID: Jackson Sellers, male    DOB: 04/04/1945,   MRN: [id number]  Chief Complaint  Patient presents with   Nail Problem    RFC.    77 y.o. male presents for concern of thickened elongated and painful nails that are difficult to trim. Requesting to have them trimmed today. Relates burning and tingling in their feet. Patient is diabetic and last A1c was  Lab Results  Component Value Date   HGBA1C 7.3 (H) 06/24/2020   .   PCP:  Shade Flood, MD    . Denies any other pedal complaints. Denies n/v/f/c.   Past Medical History:  Diagnosis Date   Arthritis    Cataract    Diabetes mellitus without complication (HCC)    GERD (gastroesophageal reflux disease)    Hypertension    Thyroid disease     Objective:  Physical Exam: Vascular: DP/PT pulses 2/4 bilateral. CFT <3 seconds. Absent hair growth on digits. Edema noted to bilateral lower extremities. Xerosis noted bilaterally.  Skin. No lacerations or abrasions bilateral feet. Nails 1-5 bilateral  are thickened discolored and elongated with subungual debris.  Musculoskeletal: MMT 5/5 bilateral lower extremities in DF, PF, Inversion and Eversion. Deceased ROM in DF of ankle joint.  Neurological: Sensation intact to light touch. Protective sensation diminished bilateral.    Assessment:   1. Pain due to onychomycosis of toenails of both feet   2. Type 2 diabetes mellitus with diabetic polyneuropathy, without long-term current use of insulin (HCC)       Plan:  Patient was evaluated and treated and all questions answered. -Discussed and educated patient on diabetic foot care, especially with  regards to the vascular, neurological and musculoskeletal systems.  -Stressed the importance of good glycemic control and the detriment of not  controlling glucose levels in relation to the foot. -Discussed supportive shoes at all times and checking feet regularly.  -Mechanically debrided all nails 1-5 bilateral using  sterile nail nipper and filed with dremel without incident  -Answered all patient questions -Patient to return  in 3 months for at risk foot care -Patient advised to call the office if any problems or questions arise in the meantime.   Louann Sjogren, DPM

## 2023-03-12 ENCOUNTER — Other Ambulatory Visit: Payer: Self-pay | Admitting: Family Medicine

## 2023-03-12 DIAGNOSIS — E785 Hyperlipidemia, unspecified: Secondary | ICD-10-CM

## 2023-03-12 DIAGNOSIS — F411 Generalized anxiety disorder: Secondary | ICD-10-CM

## 2023-03-12 DIAGNOSIS — E039 Hypothyroidism, unspecified: Secondary | ICD-10-CM

## 2023-05-11 ENCOUNTER — Ambulatory Visit: Payer: PPO | Admitting: Podiatry

## 2023-05-11 ENCOUNTER — Encounter: Payer: Self-pay | Admitting: Podiatry

## 2023-05-11 DIAGNOSIS — M79675 Pain in left toe(s): Secondary | ICD-10-CM | POA: Diagnosis not present

## 2023-05-11 DIAGNOSIS — M79674 Pain in right toe(s): Secondary | ICD-10-CM | POA: Diagnosis not present

## 2023-05-11 DIAGNOSIS — E1142 Type 2 diabetes mellitus with diabetic polyneuropathy: Secondary | ICD-10-CM | POA: Diagnosis not present

## 2023-05-11 DIAGNOSIS — B351 Tinea unguium: Secondary | ICD-10-CM

## 2023-05-11 NOTE — Progress Notes (Signed)
  Subjective:  Patient ID: Jackson Sellers, male    DOB: [birth year],   MRN: [id number]  No chief complaint on file.   78 y.o. male presents for concern of thickened elongated and painful nails that are difficult to trim. Requesting to have them trimmed today. Relates burning and tingling in their feet. Patient is diabetic and last A1c was  Lab Results  Component Value Date   HGBA1C 7.3 (H) 06/24/2020   .   PCP:  Shade Flood, MD    . Denies any other pedal complaints. Denies n/v/f/c.   Past Medical History:  Diagnosis Date   Arthritis    Cataract    Diabetes mellitus without complication (HCC)    GERD (gastroesophageal reflux disease)    Hypertension    Thyroid disease     Objective:  Physical Exam: Vascular: DP/PT pulses 2/4 bilateral. CFT <3 seconds. Absent hair growth on digits. Edema noted to bilateral lower extremities. Xerosis noted bilaterally.  Skin. No lacerations or abrasions bilateral feet. Nails 1-5 bilateral  are thickened discolored and elongated with subungual debris.  Musculoskeletal: MMT 5/5 bilateral lower extremities in DF, PF, Inversion and Eversion. Deceased ROM in DF of ankle joint.  Neurological: Sensation intact to light touch. Protective sensation diminished bilateral.    Assessment:   1. Pain due to onychomycosis of toenails of both feet   2. Type 2 diabetes mellitus with diabetic polyneuropathy, without long-term current use of insulin (HCC)       Plan:  Patient was evaluated and treated and all questions answered. -Discussed and educated patient on diabetic foot care, especially with  regards to the vascular, neurological and musculoskeletal systems.  -Stressed the importance of good glycemic control and the detriment of not  controlling glucose levels in relation to the foot. -Discussed supportive shoes at all times and checking feet regularly.  -Mechanically debrided all nails 1-5 bilateral using sterile nail nipper and filed with  dremel without incident  -Answered all patient questions -Patient to return  in 3 months for at risk foot care -Patient advised to call the office if any problems or questions arise in the meantime.   Louann Sjogren, DPM

## 2023-07-27 DIAGNOSIS — N1832 Chronic kidney disease, stage 3b: Secondary | ICD-10-CM | POA: Diagnosis not present

## 2023-08-04 DIAGNOSIS — E039 Hypothyroidism, unspecified: Secondary | ICD-10-CM | POA: Diagnosis not present

## 2023-08-04 DIAGNOSIS — N1832 Chronic kidney disease, stage 3b: Secondary | ICD-10-CM | POA: Diagnosis not present

## 2023-08-04 DIAGNOSIS — I129 Hypertensive chronic kidney disease with stage 1 through stage 4 chronic kidney disease, or unspecified chronic kidney disease: Secondary | ICD-10-CM | POA: Diagnosis not present

## 2023-08-04 DIAGNOSIS — E785 Hyperlipidemia, unspecified: Secondary | ICD-10-CM | POA: Diagnosis not present

## 2023-08-04 DIAGNOSIS — E1122 Type 2 diabetes mellitus with diabetic chronic kidney disease: Secondary | ICD-10-CM | POA: Diagnosis not present

## 2023-08-08 ENCOUNTER — Ambulatory Visit (INDEPENDENT_AMBULATORY_CARE_PROVIDER_SITE_OTHER): Admitting: Podiatry

## 2023-08-08 ENCOUNTER — Encounter: Payer: Self-pay | Admitting: Podiatry

## 2023-08-08 DIAGNOSIS — M79675 Pain in left toe(s): Secondary | ICD-10-CM

## 2023-08-08 DIAGNOSIS — E1142 Type 2 diabetes mellitus with diabetic polyneuropathy: Secondary | ICD-10-CM

## 2023-08-08 DIAGNOSIS — B351 Tinea unguium: Secondary | ICD-10-CM

## 2023-08-08 DIAGNOSIS — M79674 Pain in right toe(s): Secondary | ICD-10-CM | POA: Diagnosis not present

## 2023-08-08 NOTE — Progress Notes (Signed)
  Subjective:  Patient ID: Jackson Sellers, male    DOB: [birth year],   MRN: [id number]  No chief complaint on file.   78 y.o. male presents for concern of thickened elongated and painful nails that are difficult to trim. Requesting to have them trimmed today. Relates burning and tingling in their feet. Patient is diabetic and last A1c was  Lab Results  Component Value Date   HGBA1C 7.3 (H) 06/24/2020   .   PCP:  Shade Flood, MD    . Denies any other pedal complaints. Denies n/v/f/c.   Past Medical History:  Diagnosis Date   Arthritis    Cataract    Diabetes mellitus without complication (HCC)    GERD (gastroesophageal reflux disease)    Hypertension    Thyroid disease     Objective:  Physical Exam: Vascular: DP/PT pulses 2/4 bilateral. CFT <3 seconds. Absent hair growth on digits. Edema noted to bilateral lower extremities. Xerosis noted bilaterally.  Skin. No lacerations or abrasions bilateral feet. Nails 1-5 bilateral  are thickened discolored and elongated with subungual debris.  Musculoskeletal: MMT 5/5 bilateral lower extremities in DF, PF, Inversion and Eversion. Deceased ROM in DF of ankle joint.  Neurological: Sensation intact to light touch. Protective sensation diminished bilateral.    Assessment:   1. Pain due to onychomycosis of toenails of both feet   2. Type 2 diabetes mellitus with diabetic polyneuropathy, without long-term current use of insulin (HCC)       Plan:  Patient was evaluated and treated and all questions answered. -Discussed and educated patient on diabetic foot care, especially with  regards to the vascular, neurological and musculoskeletal systems.  -Stressed the importance of good glycemic control and the detriment of not  controlling glucose levels in relation to the foot. -Discussed supportive shoes at all times and checking feet regularly.  -Mechanically debrided all nails 1-5 bilateral using sterile nail nipper and filed with  dremel without incident  -Answered all patient questions -Patient to return  in 3 months for at risk foot care -Patient advised to call the office if any problems or questions arise in the meantime.   Louann Sjogren, DPM

## 2023-08-09 ENCOUNTER — Ambulatory Visit: Payer: PPO | Admitting: Podiatry

## 2023-08-13 ENCOUNTER — Other Ambulatory Visit: Payer: Self-pay | Admitting: Family Medicine

## 2023-08-13 DIAGNOSIS — R12 Heartburn: Secondary | ICD-10-CM

## 2023-08-13 DIAGNOSIS — R1310 Dysphagia, unspecified: Secondary | ICD-10-CM

## 2023-09-07 ENCOUNTER — Other Ambulatory Visit: Payer: Self-pay | Admitting: Family Medicine

## 2023-09-07 DIAGNOSIS — F411 Generalized anxiety disorder: Secondary | ICD-10-CM

## 2023-09-07 DIAGNOSIS — R12 Heartburn: Secondary | ICD-10-CM

## 2023-09-07 DIAGNOSIS — R1310 Dysphagia, unspecified: Secondary | ICD-10-CM

## 2023-09-07 DIAGNOSIS — E039 Hypothyroidism, unspecified: Secondary | ICD-10-CM

## 2023-09-07 DIAGNOSIS — E785 Hyperlipidemia, unspecified: Secondary | ICD-10-CM

## 2023-11-08 ENCOUNTER — Ambulatory Visit: Admitting: Podiatry

## 2023-11-15 ENCOUNTER — Ambulatory Visit (INDEPENDENT_AMBULATORY_CARE_PROVIDER_SITE_OTHER): Admitting: Podiatry

## 2023-11-15 ENCOUNTER — Encounter: Payer: Self-pay | Admitting: Podiatry

## 2023-11-15 DIAGNOSIS — M79675 Pain in left toe(s): Secondary | ICD-10-CM | POA: Diagnosis not present

## 2023-11-15 DIAGNOSIS — B351 Tinea unguium: Secondary | ICD-10-CM | POA: Diagnosis not present

## 2023-11-15 DIAGNOSIS — M79674 Pain in right toe(s): Secondary | ICD-10-CM | POA: Diagnosis not present

## 2023-11-15 DIAGNOSIS — E1142 Type 2 diabetes mellitus with diabetic polyneuropathy: Secondary | ICD-10-CM | POA: Diagnosis not present

## 2023-11-15 DIAGNOSIS — B353 Tinea pedis: Secondary | ICD-10-CM

## 2023-11-15 MED ORDER — KETOCONAZOLE 2 % EX CREA: 1.0000 | TOPICAL_CREAM | Freq: Every day | CUTANEOUS | 2 refills | Status: AC

## 2023-11-15 NOTE — Progress Notes (Signed)
  Subjective:  Patient ID: Jackson Sellers, male    DOB: 08-04-1945,   MRN: [id number]  Chief Complaint  Patient presents with   Diabetes    She's supposed to clip my toenails.  Saw Dr. Levora - 6 mos ago; A1c - 7?    78 y.o. male presents for concern of thickened elongated and painful nails that are difficult to trim. Requesting to have them trimmed today. Relates burning and tingling in their feet. Patient is diabetic and last A1c was  Lab Results  Component Value Date   HGBA1C 7.3 (H) 06/24/2020   .   PCP:  Levora Reyes SAUNDERS, MD    . Denies any other pedal complaints. Denies n/v/f/c.   Past Medical History:  Diagnosis Date   Arthritis    Cataract    Diabetes mellitus without complication (HCC)    GERD (gastroesophageal reflux disease)    Hypertension    Thyroid  disease     Objective:  Physical Exam: Vascular: DP/PT pulses 2/4 bilateral. CFT <3 seconds. Absent hair growth on digits. Edema noted to bilateral lower extremities. Xerosis noted bilaterally.  Skin. No lacerations or abrasions bilateral feet. Nails 1-5 bilateral  are thickened discolored and elongated with subungual debris. Scaling and erythema noted in interspaces 1-4 bilateral.  Musculoskeletal: MMT 5/5 bilateral lower extremities in DF, PF, Inversion and Eversion. Deceased ROM in DF of ankle joint.  Neurological: Sensation intact to light touch. Protective sensation diminished bilateral.    Assessment:   1. Pain due to onychomycosis of toenails of both feet   2. Type 2 diabetes mellitus with diabetic polyneuropathy, without long-term current use of insulin (HCC)        Plan:  Patient was evaluated and treated and all questions answered. -Discussed and educated patient on diabetic foot care, especially with  regards to the vascular, neurological and musculoskeletal systems.  -Stressed the importance of good glycemic control and the detriment of not  controlling glucose levels in relation to the  foot. -Discussed supportive shoes at all times and checking feet regularly.  -Mechanically debrided all nails 1-5 bilateral using sterile nail nipper and filed with dremel without incident  -Ketoconazole  sent for possible tinea -Answered all patient questions -Patient to return  in 3 months for at risk foot care -Patient advised to call the office if any problems or questions arise in the meantime.   Asberry Failing, DPM

## 2024-02-01 ENCOUNTER — Ambulatory Visit: Admitting: Podiatry

## 2024-02-01 ENCOUNTER — Ambulatory Visit (INDEPENDENT_AMBULATORY_CARE_PROVIDER_SITE_OTHER)

## 2024-02-01 ENCOUNTER — Encounter: Payer: Self-pay | Admitting: Podiatry

## 2024-02-01 DIAGNOSIS — S92404A Nondisplaced unspecified fracture of right great toe, initial encounter for closed fracture: Secondary | ICD-10-CM

## 2024-02-01 MED ORDER — CLINDAMYCIN HCL 300 MG PO CAPS: 300.0000 mg | ORAL_CAPSULE | Freq: Three times a day (TID) | ORAL | 0 refills | Status: AC

## 2024-02-01 NOTE — Progress Notes (Signed)
  Subjective:  Patient ID: DEANNA BOEHLKE, male    DOB: 1946/01/13,   MRN: [id number]  Chief Complaint  Patient presents with   Diabetes    My sons were moving a heavy table top and it slid into the end of my toe and pushed that nail up in there.  It has turned dark around my toenail.  The nail is sore to the touch.    78 y.o. male presents for concern of injury to his right great toe.  Relates on Saturday his sons were moving a heavy table and it slid onto his toe and pushed the nail up.  Relates it has been sore to the touch.  Denies any major bleeding.  He does relate concern for darkening around the toenail.  That is what prompted him to follow-up today.  Patient is diabetic and last A1c was  Lab Results  Component Value Date   HGBA1C 7.3 (H) 06/24/2020   .   PCP:  Levora Reyes SAUNDERS, MD    . Denies any other pedal complaints. Denies n/v/f/c.   Past Medical History:  Diagnosis Date   Arthritis    Cataract    Diabetes mellitus without complication (HCC)    GERD (gastroesophageal reflux disease)    Hypertension    Thyroid  disease     Objective:  Physical Exam: Vascular: DP/PT pulses 2/4 bilateral. CFT <3 seconds. Absent hair growth on digits. Edema noted to bilateral lower extremities. Xerosis noted bilaterally.  Skin. No lacerations or abrasions bilateral feet.  Right hallux nail with underlying darkening noted.  Surrounding erythema and ecchymosis around the nail plate.  Nail plate well adhered to the nailbed.  Tenderness to the right great toe. Musculoskeletal: MMT 5/5 bilateral lower extremities in DF, PF, Inversion and Eversion. Deceased ROM in DF of ankle joint.  Tender to right great toe and pain with range of motion of the IPJ Neurological: Sensation intact to light touch. Protective sensation diminished bilateral.    Assessment:   1. Closed nondisplaced fracture of phalanx of right great toe, unspecified phalanx, initial encounter        Plan:  Patient was  evaluated and treated and all questions answered. -Xrays reviewed.  Acute fracture noted to distal phalanx of right hallux.  Nondisplaced nonarticular. -Discussed treatement options for toe fracture; risks, alternatives, and benefits explained. -Discussed given concern for redness around the nail and diabetes will send in clindamycin as a precaution to prevent against any infection.  There does not appear to be any open lesions.  Nail plate well adhered do not believe any open wound underlying the nail plate. - Will continue in good supportive shoe as fear at surgical shoe will be a fall risk for patient. -Recommend protection, rest, ice, elevation daily until symptoms improve -Rx pain med/anti-inflammatories as needed -Patient to return to office in 2 weeks for recheck and for routine footcare.  Asberry Failing, DPM

## 2024-02-15 ENCOUNTER — Ambulatory Visit: Admitting: Podiatry

## 2024-02-15 ENCOUNTER — Encounter: Payer: Self-pay | Admitting: Podiatry

## 2024-02-15 DIAGNOSIS — E1142 Type 2 diabetes mellitus with diabetic polyneuropathy: Secondary | ICD-10-CM | POA: Diagnosis not present

## 2024-02-15 DIAGNOSIS — S92404A Nondisplaced unspecified fracture of right great toe, initial encounter for closed fracture: Secondary | ICD-10-CM

## 2024-02-15 DIAGNOSIS — B351 Tinea unguium: Secondary | ICD-10-CM

## 2024-02-15 DIAGNOSIS — M79675 Pain in left toe(s): Secondary | ICD-10-CM

## 2024-02-15 DIAGNOSIS — M79674 Pain in right toe(s): Secondary | ICD-10-CM

## 2024-02-15 NOTE — Progress Notes (Signed)
  Subjective:  Patient ID: Jackson Sellers, male    DOB: [birth year],   MRN: [id number]  Chief Complaint  Patient presents with   Diabetes    She's clipping my toenails and checking on my broken toe too.  My toe is doing fine.  I haven't had any trouble with it at all.  Saw Levora Reyes SAUNDERS, MD  - 6 mos ago; A1c - 7.2    78 y.o. male presents for follow-up of right great toe injury.  Patient has been is taking the clindamycin.  Relatestoe is doing well and no pain.  also concern for bilateral nail pain and growth.  Relates difficult to trim himself and requesting nail trim today.  Relates burning tingling and numbness in the feet.  Patient is diabetic and last A1c was  Lab Results  Component Value Date   HGBA1C 7.3 (H) 06/24/2020   .   PCP:  Levora Reyes SAUNDERS, MD    . Denies any other pedal complaints. Denies n/v/f/c.   Past Medical History:  Diagnosis Date   Arthritis    Cataract    Diabetes mellitus without complication (HCC)    GERD (gastroesophageal reflux disease)    Hypertension    Thyroid  disease     Objective:  Physical Exam: Vascular: DP/PT pulses 2/4 bilateral. CFT <3 seconds. Absent hair growth on digits. Edema noted to bilateral lower extremities. Xerosis noted bilaterally.  Skin. No lacerations or abrasions bilateral feet.  Right hallux nail with underlying darkening noted.  Surrounding erythema and ecchymosis around the nail plate.  Nail plate well adhered to the nailbed.  Tenderness to the right great toe.  Nails 1 through 5 bilateral are thickened dystrophic and elongated. Musculoskeletal: MMT 5/5 bilateral lower extremities in DF, PF, Inversion and Eversion. Deceased ROM in DF of ankle joint.  Tender to right great toe and pain with range of motion of the IPJ Neurological: Sensation intact to light touch. Protective sensation diminished bilateral.    Assessment:   1. Pain due to onychomycosis of toenails of both feet   2. Type 2 diabetes mellitus with  diabetic polyneuropathy, without long-term current use of insulin (HCC)   3. Closed nondisplaced fracture of phalanx of right great toe, unspecified phalanx, initial encounter        Plan:  Patient was evaluated and treated and all questions answered. -Xrays reviewed.  Acute fracture noted to distal phalanx of right hallux.  Nondisplaced nonarticular. -Right hallux nail appears to be healing well.No concerns today.  --Discussed and educated patient on diabetic foot care, especially with  regards to the vascular, neurological and musculoskeletal systems.  -Stressed the importance of good glycemic control and the detriment of not  controlling glucose levels in relation to the foot. -Discussed supportive shoes at all times and checking feet regularly.  -Mechanically debrided all nails 1-5 bilateral using sterile nail nipper and filed with dremel without incident  -Answered all patient questions -Patient to return  in 3 months for at risk foot care -Patient advised to call the office if any problems or questions arise in the meantime.   Asberry Failing, DPM

## 2024-02-29 ENCOUNTER — Other Ambulatory Visit: Payer: Self-pay | Admitting: Family Medicine

## 2024-02-29 DIAGNOSIS — R1310 Dysphagia, unspecified: Secondary | ICD-10-CM

## 2024-02-29 DIAGNOSIS — R12 Heartburn: Secondary | ICD-10-CM

## 2024-03-01 ENCOUNTER — Ambulatory Visit: Admitting: Family Medicine

## 2024-03-01 ENCOUNTER — Encounter: Payer: Self-pay | Admitting: Family Medicine

## 2024-03-01 VITALS — BP 110/58 | HR 86 | Temp 97.8°F | Resp 15 | Ht 69.0 in | Wt 260.0 lb

## 2024-03-01 DIAGNOSIS — J22 Unspecified acute lower respiratory infection: Secondary | ICD-10-CM | POA: Diagnosis not present

## 2024-03-01 DIAGNOSIS — R052 Subacute cough: Secondary | ICD-10-CM | POA: Diagnosis not present

## 2024-03-01 MED ORDER — GUAIFENESIN-CODEINE 100-10 MG/5ML PO SOLN: 5.0000 mL | Freq: Every evening | ORAL | 0 refills | Status: AC | PRN

## 2024-03-01 MED ORDER — DOXYCYCLINE HYCLATE 100 MG PO TABS: 100.0000 mg | ORAL_TABLET | Freq: Two times a day (BID) | ORAL | 0 refills | Status: AC

## 2024-03-01 MED ORDER — BENZONATATE 100 MG PO CAPS: 100.0000 mg | ORAL_CAPSULE | Freq: Three times a day (TID) | ORAL | 0 refills | Status: AC | PRN

## 2024-03-01 NOTE — Patient Instructions (Signed)
 Thank you for coming in today. Doxycycline twice per day for possible infection. Tessalon perles 3 times per day as needed for cough, and codeine if needed at bedtime for cough if needed. If not improving into next week, be seen. Return to the clinic or go to the nearest emergency room if any of your symptoms worsen or new symptoms occur.

## 2024-03-01 NOTE — Progress Notes (Signed)
 Subjective:  Patient ID: Jackson Sellers, male    DOB: Sep 24, 1945  Age: 78 y.o. MRN: [id number]  CC:  Chief Complaint  Patient presents with   Cough    cough, runny nose, and unable to sleep for more than a couple of hours. Fatigues. Drainage is waking him up. Coughing attacks. Chills. ST when he first started showing sx. Sx started 2 weeks ago.     HPI KION HUNTSBERRY presents for   Acute visit for above  Cough: Symptoms above, started 2 weeks ago, sore throat initially, some chills, coughing fits, postnasal drainage affecting sleep.  Only sleeping a few hours.  Persistent cough and runny nose. No recent fever, chills still in the evening.  Sick contacts - both sons with similar sx's. Both are on abx.  Feels like about the same, coughing fits at night. Discolored phlegm. No dyspnea.   Tx: otc cough medicine - sugar free. Min relief.    History Patient Active Problem List   Diagnosis Date Noted   Vertigo 04/12/2012   Diabetes (HCC) 02/22/2012   Hypertension 02/22/2012   GERD (gastroesophageal reflux disease) 02/22/2012   Hyperlipidemia 02/22/2012   Hypothyroid 02/22/2012   Arthritis 02/22/2012   Past Medical History:  Diagnosis Date   Arthritis    Cataract    Diabetes mellitus without complication (HCC)    GERD (gastroesophageal reflux disease)    Hypertension    Thyroid  disease    Past Surgical History:  Procedure Laterality Date   EYE SURGERY     FOOT SURGERY     HERNIA REPAIR     TONSILLECTOMY     Allergies  Allergen Reactions   Metoclopramide Hcl Other (See Comments)    Pt states that he felt like something was crawling all over me   Erythrocin Rash   Penicillins Rash    Has patient had a PCN reaction causing immediate rash, facial/tongue/throat swelling, SOB or lightheadedness with hypotension: No Has patient had a PCN reaction causing severe rash involving mucus membranes or skin necrosis: No Has patient had a PCN reaction that required  hospitalization No Has patient had a PCN reaction occurring within the last 10 years:No If all of the above answers are NO, then may proceed with Cephalosporin use.    Prior to Admission medications   Medication Sig Start Date End Date Taking? Authorizing Provider  aspirin  81 MG tablet Take 1 tablet (81 mg total) by mouth daily. 05/26/11  Yes Humberto Elspeth LABOR, MD  atorvastatin  (LIPITOR) 10 MG tablet TAKE 1 TABLET BY MOUTH EVERY DAY 09/08/23  Yes Levora Reyes SAUNDERS, MD  chlorthalidone (HYGROTON) 25 MG tablet Take 25 mg by mouth daily.   Yes [provider]  cholecalciferol (VITAMIN D ) 1000 UNITS tablet Take 1 tablet (1,000 Units total) by mouth 2 (two) times daily. 05/26/11  Yes Humberto Elspeth LABOR, MD  empagliflozin (JARDIANCE) 25 MG TABS tablet Take 12.5 mg by mouth daily.   Yes [provider]  FLUoxetine  (PROZAC ) 20 MG capsule TAKE 1 CAPSULE BY MOUTH EVERY DAY 09/08/23  Yes Levora Reyes SAUNDERS, MD  levothyroxine  (SYNTHROID ) 112 MCG tablet TAKE 1 TABLET BY MOUTH EVERY DAY BEFORE BREAKFAST 09/08/23  Yes Levora Reyes SAUNDERS, MD  lisinopril  (ZESTRIL ) 20 MG tablet Take 20 mg by mouth daily. 04/16/20  Yes [provider]  omeprazole  (PRILOSEC) 20 MG capsule TAKE 1 CAPSULE BY MOUTH EVERY DAY 02/29/24  Yes Levora Reyes SAUNDERS, MD  furosemide  (LASIX ) 20 MG tablet Take 1 tablet (  20 mg total) by mouth daily. Patient not taking: Reported on 03/01/2024 10/23/21   Levora Reyes SAUNDERS, MD  ketoconazole  (NIZORAL ) 2 % cream Apply 1 Application topically daily. Patient not taking: Reported on 03/01/2024 11/15/23   Sikora, Rebecca, DPM  mupirocin  ointment (BACTROBAN ) 2 % Apply 1 Application topically 2 (two) times daily. Top open wounds for 1 week. Patient not taking: Reported on 03/01/2024 10/19/21   Levora Reyes SAUNDERS, MD  potassium chloride  (KLOR-CON  M) 10 MEQ tablet Take 1 tablet (10 mEq total) by mouth daily. Patient not taking: Reported on 03/01/2024 10/23/21   Levora Reyes SAUNDERS, MD  valACYclovir   (VALTREX ) 1000 MG tablet Take 1 tablet (1,000 mg total) by mouth 3 (three) times daily. Patient not taking: Reported on 03/01/2024 10/19/21   Levora Reyes SAUNDERS, MD   Social History   Socioeconomic History   Marital status: Married    Spouse name: Not on file   Number of children: Not on file   Years of education: Not on file   Highest education level: Not on file  Occupational History   Occupation: Retired  Tobacco Use   Smoking status: Never   Smokeless tobacco: Never  Vaping Use   Vaping status: Never Used  Substance and Sexual Activity   Alcohol use: No   Drug use: No   Sexual activity: Yes  Other Topics Concern   Not on file  Social History Narrative   Married   Education: Automotive Engineer   Exercise: Yes   Social Drivers of Health   Financial Resource Strain: Not on file  Food Insecurity: No Food Insecurity (07/12/2019)   Hunger Vital Sign    Worried About Running Out of Food in the Last Year: Never true    Ran Out of Food in the Last Year: Never true  Transportation Needs: No Transportation Needs (07/12/2019)   PRAPARE - Administrator, Civil Service (Medical): No    Lack of Transportation (Non-Medical): No  Physical Activity: Not on file  Stress: Not on file  Social Connections: Not on file  Intimate Partner Violence: Not on file    Review of Systems  Per HPI.  Objective:   Vitals:   03/01/24 1134  BP: (!) 110/58  Pulse: 86  Resp: 15  Temp: 97.8 F (36.6 C)  TempSrc: Temporal  SpO2: 97%  Weight: 260 lb (117.9 kg)  Height: 5' 9 (1.753 m)     Physical Exam Vitals reviewed.  Constitutional:      Appearance: He is well-developed.  HENT:     Head: Normocephalic and atraumatic.     Right Ear: Tympanic membrane, ear canal and external ear normal.     Left Ear: Tympanic membrane, ear canal and external ear normal.     Nose: No rhinorrhea.     Mouth/Throat:     Pharynx: No oropharyngeal exudate or posterior oropharyngeal erythema.  Eyes:      Conjunctiva/sclera: Conjunctivae normal.     Pupils: Pupils are equal, round, and reactive to light.  Cardiovascular:     Rate and Rhythm: Normal rate and regular rhythm.     Heart sounds: Normal heart sounds. No murmur heard. Pulmonary:     Effort: Pulmonary effort is normal.     Breath sounds: Normal breath sounds. No wheezing, rhonchi or rales.  Abdominal:     Palpations: Abdomen is soft.     Tenderness: There is no abdominal tenderness.  Musculoskeletal:     Cervical back: Neck supple.  Lymphadenopathy:  Cervical: No cervical adenopathy.  Skin:    General: Skin is warm and dry.     Findings: No rash.  Neurological:     Mental Status: He is alert and oriented to person, place, and time.  Psychiatric:        Behavior: Behavior normal.        Assessment & Plan:  DEONTRE ALLSUP is a 78 y.o. male . Subacute cough - Plan: benzonatate (TESSALON) 100 MG capsule, guaiFENesin-codeine 100-10 MG/5ML syrup, doxycycline (VIBRA-TABS) 100 MG tablet  LRTI (lower respiratory tract infection) - Plan: benzonatate (TESSALON) 100 MG capsule, guaiFENesin-codeine 100-10 MG/5ML syrup, doxycycline (VIBRA-TABS) 100 MG tablet  2 week hx of cough - possible initial viral illness, now with persistent sx's, chills, bronchitis vs.  less likely community acquired pneumonia. Lungs clear. Start doxycycline - possible side effects/risks discussed. Tessalon perles if needed, codeine only if needed at night with risks discussed.   Meds ordered this encounter  Medications   benzonatate (TESSALON) 100 MG capsule    Sig: Take 1 capsule (100 mg total) by mouth 3 (three) times daily as needed for cough.    Dispense:  20 capsule    Refill:  0   guaiFENesin-codeine 100-10 MG/5ML syrup    Sig: Take 5 mLs by mouth at bedtime as needed for cough.    Dispense:  120 mL    Refill:  0   doxycycline (VIBRA-TABS) 100 MG tablet    Sig: Take 1 tablet (100 mg total) by mouth 2 (two) times daily.    Dispense:  20  tablet    Refill:  0   Patient Instructions  Thank you for coming in today. Doxycycline twice per day for possible infection. Tessalon perles 3 times per day as needed for cough, and codeine if needed at bedtime for cough if needed. If not improving into next week, be seen. Return to the clinic or go to the nearest emergency room if any of your symptoms worsen or new symptoms occur.      Signed,   Reyes Pines, MD [facility], [facility] 03/01/24 12:18 PM

## 2024-04-02 ENCOUNTER — Other Ambulatory Visit: Payer: Self-pay | Admitting: Family Medicine

## 2024-04-02 DIAGNOSIS — R052 Subacute cough: Secondary | ICD-10-CM

## 2024-04-02 DIAGNOSIS — J22 Unspecified acute lower respiratory infection: Secondary | ICD-10-CM

## 2024-04-08 ENCOUNTER — Other Ambulatory Visit: Payer: Self-pay | Admitting: Family Medicine

## 2024-04-08 DIAGNOSIS — E785 Hyperlipidemia, unspecified: Secondary | ICD-10-CM

## 2024-04-08 DIAGNOSIS — E039 Hypothyroidism, unspecified: Secondary | ICD-10-CM

## 2024-04-08 DIAGNOSIS — F411 Generalized anxiety disorder: Secondary | ICD-10-CM

## 2024-05-16 ENCOUNTER — Ambulatory Visit: Admitting: Podiatry
